# Patient Record
Sex: Female | Born: 1970 | Race: White | Hispanic: No | Marital: Married | Smoking: Never smoker
Health system: Southern US, Community
[De-identification: ages and names within clinical notes are randomized; demographics above are authoritative.]

## PROBLEM LIST (undated history)

## (undated) DIAGNOSIS — T8859XA Other complications of anesthesia, initial encounter: Secondary | ICD-10-CM

## (undated) DIAGNOSIS — T4145XA Adverse effect of unspecified anesthetic, initial encounter: Secondary | ICD-10-CM

## (undated) DIAGNOSIS — T884XXA Failed or difficult intubation, initial encounter: Secondary | ICD-10-CM

## (undated) HISTORY — DX: Failed or difficult intubation, initial encounter: T88.4XXA

## (undated) HISTORY — DX: Other complications of anesthesia, initial encounter: T88.59XA

---
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
## 2019-09-10 ENCOUNTER — Other Ambulatory Visit: Payer: Self-pay

## 2019-09-10 ENCOUNTER — Encounter
Payer: Medicare Other | Attending: Physical Medicine and Rehabilitation | Admitting: Physical Medicine and Rehabilitation

## 2019-09-10 ENCOUNTER — Encounter: Payer: Self-pay | Admitting: Physical Medicine and Rehabilitation

## 2019-09-10 VITALS — BP 119/83 | HR 79 | Temp 97.2°F

## 2019-09-10 DIAGNOSIS — N319 Neuromuscular dysfunction of bladder, unspecified: Secondary | ICD-10-CM | POA: Diagnosis not present

## 2019-09-10 DIAGNOSIS — Z79891 Long term (current) use of opiate analgesic: Secondary | ICD-10-CM | POA: Insufficient documentation

## 2019-09-10 DIAGNOSIS — G8221 Paraplegia, complete: Secondary | ICD-10-CM | POA: Diagnosis not present

## 2019-09-10 DIAGNOSIS — G909 Disorder of the autonomic nervous system, unspecified: Secondary | ICD-10-CM | POA: Insufficient documentation

## 2019-09-10 DIAGNOSIS — G894 Chronic pain syndrome: Secondary | ICD-10-CM

## 2019-09-10 DIAGNOSIS — Z5181 Encounter for therapeutic drug level monitoring: Secondary | ICD-10-CM | POA: Insufficient documentation

## 2019-09-10 MED ORDER — PREGABALIN 25 MG PO CAPS: 25.0000 mg | ORAL_CAPSULE | Freq: Two times a day (BID) | ORAL | 2 refills | Status: DC

## 2019-09-10 MED ORDER — OXYCODONE-ACETAMINOPHEN 5-325 MG PO TABS: 1.0000 | ORAL_TABLET | Freq: Four times a day (QID) | ORAL | 0 refills | Status: AC | PRN

## 2019-09-10 NOTE — Patient Instructions (Signed)
1. Neurogenic bladder- needs to see Dr Burman Foster or Dr Johnney Ou. Dr Johnney Ou is at [facility].  2. Autonomic Dysfunction- Symptomatically, having clear autonomic dysfunction- however, what's the trigger?- needs xrays to determine if has a fracture that could be causing the Autonomic dysfunction-if they are negative, then look for syrinx in thoracic spine, possibly cervical/lumbar spine prn.  3. Try Lyrica 25 mg 2x/day x 1 week then 50 mg 2x/day- for autonomic dysfunction   4. Percocet is the only thing helping her Symptoms- Percocet 5/325 mg up to 4x/day as needed- #120- will also get pain medicine contract, and oral drug screen swab done.   5.  If no fracture, will look for syrinx.  6. F/U in 1 month

## 2019-09-10 NOTE — Progress Notes (Signed)
Subjective:    Patient ID: Rachael CollaMelissa Oyama, female    DOB: [birth year], 48 y.o.   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   Pain, nausea, gas blowup, flushing- from UTIs- before treated for UTI. Percocet would treat Sx's Had 2 days left in UTI treatment- (determined by urine Cx),   By Friday, taking Percocet now and then; by Monday (dropped of U/A)- taking Percocet q6 hours- and sitting still to get through day.  So sick by Wednesday couldn't get self dressed- so sick couldn't sit upright. L kidney was a little "Distended" placed on IV ABX x 3 days- both samples came back basically clean. Sent for renal flow study- function of kidney good; draining a little slower  There for 1 week- had no clue what was going on. Takes less and less movement/Sx's to make her horribly sick-  So sick for so long, and frustrated. Let's try gabapentin- was started on gabapentin- horribly sick from Gabapentin- more dizzy/faint/explosive diarrhea, still problems with loose stools x 3 doses of gabapentin. Tested for COVID in hospital. Was (-) [facility] and without contrast.  No big difference between sitting or laying down BP.   Sitting up straight gets her sick/dizzy/nauseated/swelling up with tons of gas, and dry heaves- percocet has made it better (doesn't get her nauseated) Knot at bottom of coccyx is the only thing she noticed- doesn't think has had any trauma/falls/any reason to have pain.  No periods anymore.  Tried flushes with vinegar and gentamycin flushes- still gets 10-12 UTIs/year.  Needs to go back to Dr Gala LewandowskyBadlani- seeing another Urologist- Dr Patrice ParadiseGuiterrez- "[facility]". Stopped calcium- kidney stones are done since stopped calcium.  Meds: Dantrolene Baclofen Warfarin Magic Bullet M/W/F   PT- does dry  needling and spinal mobility technique that has helped her migraines.     Pain Inventory Average Pain 6 Pain Right Now 6 My pain is I can't feel it directly just the side effects  In the last 24 hours, has pain interfered with the following? General activity 10 Relation with others 10 Enjoyment of life 10 What TIME of day is your pain at its worst? morning and daytime and evening Sleep (in general) Poor  Pain is worse with: bending, sitting and some activites Pain improves with: rest and medication Relief from Meds: 5  Mobility ability to climb steps?  no do you drive?  yes use a wheelchair needs help with transfers transfers alone  Function retired I need assistance with the following:  bathing, toileting and household duties  Neuro/Psych bladder control problems bowel control problems numbness tingling spasms dizziness depression anxiety  Prior Studies CT/MRI  Physicians involved in your care Any changes since last visit?  no   No family history on file. Social History   Socioeconomic History  . Marital status: Unknown    Spouse name: Not on file  . Number of children: Not on file  . Years of education: Not on file  . Highest education level: Not on file  Occupational History  . Not on file  Social Needs  . Financial resource strain: Not on file  . Food insecurity    Worry: Not on file    Inability: Not on file  . Transportation needs    Medical: Not on file    Non-medical: Not on file  Tobacco Use  . Smoking status: Not on file  Substance and Sexual Activity  . Alcohol use: Not on file  . Drug use: Not on file  . Sexual activity: Not on file  Lifestyle  . Physical activity    Days per week: Not on file    Minutes per session: Not on file  . Stress: Not on file  Relationships  . Social Herbalist on phone: Not on file    Gets together: Not on file    Attends religious service: Not on file    Active member of club or  organization: Not on file    Attends meetings of clubs or organizations: Not on file    Relationship status: Not on file  Other Topics Concern  . Not on file  Social History Narrative  . Not on file   History reviewed. No pertinent surgical history. Past Medical History:  Diagnosis Date  . Complication of anesthesia   . Difficult intubation    BP 119/83   Pulse 79   Temp (!) 97.2 F (36.2 C)   SpO2 98%   Opioid Risk Score:   Fall Risk Score:  `1  Depression screen PHQ 2/9  No flowsheet data found.   Review of Systems  Constitutional: Positive for appetite change and diaphoresis. Negative for fever.  HENT: Negative.   Eyes: Negative.   Respiratory: Positive for apnea.   Gastrointestinal: Positive for abdominal pain, constipation, diarrhea, nausea and vomiting.  Endocrine: Negative.   Genitourinary: Negative.   Musculoskeletal: Negative.   Skin: Negative.   Allergic/Immunologic: Negative.   Neurological: Negative.   Hematological: Negative.   Psychiatric/Behavioral: Negative.   All other systems reviewed and are negative.      Objective:   Physical Exam Vitals reviewed Awake, alert, appropriate, sitting up in power w/c, leaning in recline/tilt,  NAD Keeps closing her eyes so concentrate Scalenes, splenius capitus  Levators, much looser than 5 years ago;  Upper traps are also looser, but not as much as other muscles LEs- 0/5 in LEs B/L Neuro- fine clonus of LEs 4-5 beats, MAS of 1 to 1+  B/L     Assessment & Plan:   Pt is a 48 yr old female with T7 paraplegia neurogenic bowel and bladder, uroconduit, Spasticity, and autonomic dysfunction due to an unknown cause.  1. Neurogenic bladder- needs to see Dr Burman Foster or Dr Johnney Ou. Dr Johnney Ou is at [facility].  2. Autonomic Dysfunction- Symptomatically, having clear autonomic dysfunction- however, what's the trigger?- needs xrays -ordered Pelvic, femur, knees, and feet xrays B/L to determine if has a  fracture that could be causing the Autonomic dysfunction-if they are negative, then look for syrinx in thoracic spine, possibly cervical/lumbar spine prn.  3. Try Lyrica 25 mg 2x/day x 1 week then 50 mg 2x/day- for autonomic dysfunction   4. Percocet is the only thing helping her Symptoms- Percocet 5/325 mg up to 4x/day as needed- #120- will also get pain medicine contract, and oral drug screen swab done.   5.  If no fracture, will look for syrinx.  6. F/U in 1 month   I spent more than 70 minutes on appointment- more than 40 minutes discussing spasticity, autonomic dysfunction- how to dx, treat, and pain meds- will try Lyrica, but had problems with Gabapentin. Also needs new urologist- made recs.

## 2019-09-12 ENCOUNTER — Other Ambulatory Visit: Payer: Self-pay | Admitting: Physical Medicine and Rehabilitation

## 2019-09-12 ENCOUNTER — Telehealth: Payer: Self-pay

## 2019-09-12 ENCOUNTER — Ambulatory Visit (HOSPITAL_COMMUNITY)
Admission: RE | Admit: 2019-09-12 | Discharge: 2019-09-12 | Disposition: A | Discharge status: Home / Self Care | Payer: Medicare Other | Source: Ambulatory Visit | Attending: Physical Medicine and Rehabilitation | Admitting: Physical Medicine and Rehabilitation

## 2019-09-12 ENCOUNTER — Other Ambulatory Visit: Payer: Self-pay

## 2019-09-12 DIAGNOSIS — G8221 Paraplegia, complete: Secondary | ICD-10-CM

## 2019-09-12 DIAGNOSIS — G909 Disorder of the autonomic nervous system, unspecified: Secondary | ICD-10-CM

## 2019-09-12 NOTE — Telephone Encounter (Signed)
Message from patient

## 2019-09-12 NOTE — Telephone Encounter (Signed)
Megan from Radiology called stating protocol for pelvis is one view not 3 views only do 3 view for surgeons and can get a better view of the knee with a femur. Orders changed.

## 2019-09-13 ENCOUNTER — Telehealth: Payer: Self-pay | Admitting: *Deleted

## 2019-09-13 NOTE — Telephone Encounter (Signed)
Prior auth submitted to [employer] via CoverMyMeds for pregabalin 25 mg #120.

## 2019-09-15 LAB — DRUG TOX MONITOR 1 W/CONF, ORAL FLD
Amphetamines: NEGATIVE ng/mL (ref <10)
Barbiturates: NEGATIVE ng/mL (ref <10)
Benzodiazepines: NEGATIVE ng/mL (ref <0.50)
Buprenorphine: NEGATIVE ng/mL (ref <0.10)
Cocaine: NEGATIVE ng/mL (ref <5.0)
Codeine: NEGATIVE ng/mL (ref <2.5)
Dihydrocodeine: NEGATIVE ng/mL (ref <2.5)
Fentanyl: NEGATIVE ng/mL (ref <0.10)
Heroin Metabolite: NEGATIVE ng/mL (ref <1.0)
Hydrocodone: NEGATIVE ng/mL (ref <2.5)
Hydromorphone: NEGATIVE ng/mL (ref <2.5)
MARIJUANA: NEGATIVE ng/mL (ref <2.5)
MDMA: NEGATIVE ng/mL (ref <10)
Meprobamate: NEGATIVE ng/mL (ref <2.5)
Methadone: NEGATIVE ng/mL (ref <5.0)
Morphine: NEGATIVE ng/mL (ref <2.5)
Nicotine Metabolite: NEGATIVE ng/mL (ref <5.0)
Norhydrocodone: NEGATIVE ng/mL (ref <2.5)
Noroxycodone: NEGATIVE ng/mL (ref <2.5)
Opiates: NEGATIVE ng/mL (ref <2.5)
Oxymorphone: NEGATIVE ng/mL (ref <2.5)
Phencyclidine: NEGATIVE ng/mL (ref <10)
Tapentadol: NEGATIVE ng/mL (ref <5.0)
Tramadol: NEGATIVE ng/mL (ref <5.0)
Zolpidem: NEGATIVE ng/mL (ref <5.0)

## 2019-09-15 LAB — DRUG TOX ALC METAB W/CON, ORAL FLD: Alcohol Metabolite: NEGATIVE ng/mL (ref <25)

## 2019-09-17 NOTE — Telephone Encounter (Signed)
Prior auth for Pregabalin was denied. An appeal will be initiated. [phone]).

## 2019-09-17 NOTE — Telephone Encounter (Signed)
Great- thank you- just let me know. She's already on Cymbalta and failed Gabapentin.

## 2019-09-18 ENCOUNTER — Telehealth: Payer: Self-pay | Admitting: *Deleted

## 2019-09-18 NOTE — Telephone Encounter (Signed)
Oral drug screen had insufficient specimen to complete review, so could not confirm presence or absence of oxycodone. Last dose of oxycodone was reported as same day of test.

## 2019-09-28 ENCOUNTER — Telehealth: Payer: Self-pay | Admitting: *Deleted

## 2019-09-28 DIAGNOSIS — G8221 Paraplegia, complete: Secondary | ICD-10-CM

## 2019-09-28 DIAGNOSIS — G909 Disorder of the autonomic nervous system, unspecified: Secondary | ICD-10-CM

## 2019-09-28 MED ORDER — PREGABALIN 50 MG PO CAPS: 50.0000 mg | ORAL_CAPSULE | Freq: Two times a day (BID) | ORAL | 2 refills | Status: DC

## 2019-09-28 MED ORDER — PREGABALIN 25 MG PO CAPS: 25.0000 mg | ORAL_CAPSULE | Freq: Two times a day (BID) | ORAL | 0 refills | Status: DC

## 2019-09-28 NOTE — Telephone Encounter (Signed)
I spoke with pharmacist at [facility] about whether Rashelle was able to get her pregabalin filled because the prior auth was denied. He said that it was on hold because it was several hundred dollars.  He said if we write the Rx for the 25 mg bid x 1 week, and then submit a second Rx for the 50 mg bid, it will go through and be covered.  I spoke with Dr Dagoberto Ligas and I will call in the Rxs as directed by the pharmacist. I let Kamalani know.  I have also added [facility] in [place] to her list which she uses as well.

## 2019-10-08 ENCOUNTER — Encounter: Payer: Self-pay | Admitting: Physical Medicine and Rehabilitation

## 2019-10-08 ENCOUNTER — Other Ambulatory Visit: Payer: Self-pay

## 2019-10-08 ENCOUNTER — Encounter
Payer: Medicare Other | Attending: Physical Medicine and Rehabilitation | Admitting: Physical Medicine and Rehabilitation

## 2019-10-08 VITALS — BP 113/73 | HR 75 | Temp 98.8°F

## 2019-10-08 DIAGNOSIS — G8221 Paraplegia, complete: Secondary | ICD-10-CM | POA: Diagnosis not present

## 2019-10-08 DIAGNOSIS — Z79891 Long term (current) use of opiate analgesic: Secondary | ICD-10-CM | POA: Diagnosis present

## 2019-10-08 DIAGNOSIS — M7918 Myalgia, other site: Secondary | ICD-10-CM | POA: Diagnosis not present

## 2019-10-08 DIAGNOSIS — Z5181 Encounter for therapeutic drug level monitoring: Secondary | ICD-10-CM | POA: Diagnosis present

## 2019-10-08 DIAGNOSIS — G909 Disorder of the autonomic nervous system, unspecified: Secondary | ICD-10-CM | POA: Insufficient documentation

## 2019-10-08 DIAGNOSIS — G894 Chronic pain syndrome: Secondary | ICD-10-CM | POA: Insufficient documentation

## 2019-10-08 DIAGNOSIS — R252 Cramp and spasm: Secondary | ICD-10-CM | POA: Diagnosis not present

## 2019-10-08 DIAGNOSIS — N319 Neuromuscular dysfunction of bladder, unspecified: Secondary | ICD-10-CM

## 2019-10-08 MED ORDER — PREGABALIN 100 MG PO CAPS: 100.0000 mg | ORAL_CAPSULE | Freq: Two times a day (BID) | ORAL | 5 refills | Status: DC

## 2019-10-08 NOTE — Progress Notes (Signed)
Subjective:    Patient ID: Rachael Turner, female    DOB: 02-02-1971, 48 y.o.   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   A lot better since a new UTI bug was dx'd- getting the nephrostomy stent might have made improvement as well-   Was 8/10 in terms of function at last visit; now 3/10- it took a week to feel better on IV ABX- recovery was [facility] slower than prior.  Want to change Nephrostomy tube/stent early- has appt Friday- supposedly, but has no Appointment- waiting for getting appt for nephrostomy still-  To con't ABX until Nephrostomy tube is changed.  Drove for first time today- felt really hot/icky- stopped and leaned back and it passed. Had to lean back 2-3x in [employer]- and even got to eat out that day.  1 Oxy yesterday and 1 Oxy today- and went 3-4 days before that.  On 2nd day of full dose of Lyrica- 50 mg BID- first few days of Lyrica 25 mg BID could sleep all day long- not happening now.  Spasticity/abd spasms are still up- has been up for awhile and hasn't gone down- - took Valium - thought should be better, but explained it won't since has nephrostomy tube.  Muscles tight in upper back- itching so bad and tight muscles.     Pain Inventory Average Pain 5 Pain Right Now 3 My pain is intermittent  In the last 24 hours, has pain interfered with the following? General activity 5 Relation with others 5 Enjoyment of life 5 What TIME of day is your pain at its worst? with activity Sleep (in general) Fair  Pain is worse with: some activites Pain improves with: rest, medication and leaning and tilting back Relief from Meds: 8  Mobility do you drive?  yes use a wheelchair needs help with transfers  Function  not employed: date last employed 05/2009 disabled: date disabled 10/08/2007 I need assistance with the following:  dressing, bathing and toileting  Neuro/Psych bowel control problems numbness tingling spasms dizziness depression anxiety  Prior Studies Any changes since last visit?  no  Physicians involved in your care Any changes since last visit?  no   No family history on file. Social History   Socioeconomic History  . Marital status: Unknown    Spouse name: Not on file  . Number of children: Not on file  . Years of education: Not on file  . Highest education level: Not on file  Occupational History  . Not on file  Social Needs  . Financial resource strain: Not on file  . Food insecurity    Worry: Not on file    Inability: Not on file  . Transportation needs    Medical: Not on file    Non-medical: Not on file  Tobacco Use  . Smoking status: Never Smoker  . Smokeless tobacco: Never Used  Substance and Sexual Activity  . Alcohol use: Never    Frequency: Never  . Drug use: Never  . Sexual activity: Yes    Birth control/protection: Surgical  Lifestyle  . Physical activity    Days per week: Not on file    Minutes per session: Not on file  . Stress: Not on file  Relationships  . Social connections  Talks on phone: Not on file    Gets together: Not on file    Attends religious service: Not on file    Active member of club or organization: Not on file    Attends meetings of clubs or organizations: Not on file    Relationship status: Not on file  Other Topics Concern  . Not on file  Social History Narrative  . Not on file   No past surgical history on file. Past Medical History:  Diagnosis Date  . Complication of anesthesia   . Difficult intubation    BP 113/73   Pulse 75   Temp 98.8 F (37.1 C)   SpO2 96%   Opioid Risk Score:   Fall Risk Score:  `1  Depression screen PHQ 2/9  Dep[facility] screen [facility] 2/9 10/08/2019 09/10/2019  Decreased  Interest 2 2  Down, Depressed, Hopeless 1 1  PHQ - 2 Score 3 3  Altered sleeping - 3  Tired, decreased energy - 3  Change in appetite - 3  Feeling bad or failure about yourself  - 1  Trouble concentrating - 1  Moving slowly or fidgety/restless - 1  Suicidal thoughts - 0  PHQ-9 Score - 15  Difficult doing work/chores - Very difficult    Review of Systems  Constitutional: Negative.   HENT: Negative.   Eyes: Negative.   Respiratory: Negative.   Cardiovascular: Positive for leg swelling.  Gastrointestinal: Positive for abdominal pain, constipation, diarrhea, nausea and vomiting.  Endocrine: Negative.   Genitourinary:       Had recent kidney infection  Musculoskeletal:       Spasms  Allergic/Immunologic: Negative.   Neurological: Positive for dizziness and numbness.       Tingling  Hematological: Negative.   Psychiatric/Behavioral: Positive for dysphoric mood. The patient is nervous/anxious.   All other systems reviewed and are negative.      Objective:   Physical Exam  Awake, alert, appropriate, in power w/c, NAD MS T7 paraplegia Trigger points in upper traps, levators, rhomboids and scalenes, and pecs Neuro-MAS in LEs is 2      Assessment & Plan:   Patient is here for f/u on autonomic dysfunction, with T7 paraplegia, spasticity, neurogenic bowel and bladder, as well as frequent unusual UTIs.   Patient here for trigger point injections for  Consent done and on chart.  Cleaned areas with alcohol and injected using a 27 gauge 1.5 inch needle  Injected 6cc Using 1% Lidocaine with no EPI  Upper traps B/L Levators B/L Posterior scalenes B/L Middle scalenes Splenius Capitus B/L Pectoralis Major- B/L Rhomboids- B/L Infraspinatus Teres Major/minor Thoracic paraspinals Lumbar paraspinals Other injections-    Patient's level of pain prior was 5/10  Current level of pain after injections is- not sure if has improvement, but felt good when hit "spots"  There  was no bleeding or complications.  Patient was advised to drink a lot of water on day after injections to flush system Will have increased soreness for 12-48 hours after injections.  Can use Lidocaine patches the day AFTER injections Can use theracane on day of injections in places didn't inject Can use heating pad 4-6 hours AFTER injections   2. Suggest Valium for abdominal spasms- gold standard- try to avoid oxycodone at same time as Valium. They potentiate each other- so make each other stronger.   3.  For upper back, neck tightness/itching,  Do Flexeril or myofascial release- 2-4 minutes using a theracane  Or tennis ball- for  tight upper back trigger points.   4. Don't expect spasticity to improve until nephrostomy tube is out x 1 week longer.   5.  Lyrica- will increase to 100 mg BID for autonomic dysfunction.   I spent a total of 1 hour on appointment -more than 30 minutes educating on myofascial pain, spasticity and which meds to work. Also did trigger point injections as well -

## 2019-10-08 NOTE — Patient Instructions (Signed)
  Patient is here for f/u on autonomic dysfunction, with T7 paraplegia, spasticity, neurogenic bowel and bladder, as well as frequent unusual UTIs.   Patient here for trigger point injections for  Consent done and on chart.  Cleaned areas with alcohol and injected using a 27 gauge 1.5 inch needle  Injected 6cc Using 1% Lidocaine with no EPI  Upper traps B/L Levators B/L Posterior scalenes B/L Middle scalenes Splenius Capitus B/L Pectoralis Major- B/L Rhomboids- B/L Infraspinatus Teres Major/minor Thoracic paraspinals Lumbar paraspinals Other injections-    Patient's level of pain prior was 5/10  Current level of pain after injections is- not sure if has improvement, but felt good when hit "spots"  There was no bleeding or complications.  Patient was advised to drink a lot of water on day after injections to flush system Will have increased soreness for 12-48 hours after injections.  Can use Lidocaine patches the day AFTER injections Can use theracane on day of injections in places didn't inject Can use heating pad 4-6 hours AFTER injections   2. Suggest Valium for abdominal spasms- gold standard- try to avoid oxycodone at same time as Valium. They potentiate each other- so make each other stronger.   3.  For upper back, neck tightness/itching,  Do Flexeril or myofascial release- 2-4 minutes using a theracane  Or tennis ball- for tight upper back trigger points.   4. Don't expect spasticity to improve until nephrostomy tube is out x 1 week longer.   5.  Lyrica- will increase to 100 mg BID for autonomic dysfunction.

## 2019-10-15 NOTE — Telephone Encounter (Signed)
Message from patient

## 2019-11-16 ENCOUNTER — Other Ambulatory Visit: Payer: Self-pay

## 2019-11-16 ENCOUNTER — Encounter: Payer: Self-pay | Admitting: Physical Medicine and Rehabilitation

## 2019-11-16 ENCOUNTER — Encounter
Payer: Medicare Other | Attending: Physical Medicine and Rehabilitation | Admitting: Physical Medicine and Rehabilitation

## 2019-11-16 VITALS — BP 118/80 | HR 85 | Temp 97.2°F

## 2019-11-16 DIAGNOSIS — Z79891 Long term (current) use of opiate analgesic: Secondary | ICD-10-CM | POA: Diagnosis present

## 2019-11-16 DIAGNOSIS — G8221 Paraplegia, complete: Secondary | ICD-10-CM

## 2019-11-16 DIAGNOSIS — G894 Chronic pain syndrome: Secondary | ICD-10-CM | POA: Diagnosis present

## 2019-11-16 DIAGNOSIS — N319 Neuromuscular dysfunction of bladder, unspecified: Secondary | ICD-10-CM | POA: Diagnosis not present

## 2019-11-16 DIAGNOSIS — R252 Cramp and spasm: Secondary | ICD-10-CM | POA: Diagnosis not present

## 2019-11-16 DIAGNOSIS — G909 Disorder of the autonomic nervous system, unspecified: Secondary | ICD-10-CM | POA: Diagnosis not present

## 2019-11-16 DIAGNOSIS — Z5181 Encounter for therapeutic drug level monitoring: Secondary | ICD-10-CM | POA: Insufficient documentation

## 2019-11-16 DIAGNOSIS — N390 Urinary tract infection, site not specified: Secondary | ICD-10-CM

## 2019-11-16 MED ORDER — OXYCODONE-ACETAMINOPHEN 5-325 MG PO TABS: 1.0000 | ORAL_TABLET | Freq: Four times a day (QID) | ORAL | 0 refills | Status: DC | PRN

## 2019-11-16 MED ORDER — 0.9 % SODIUM CHLORIDE (POUR BTL) OPTIME: 1000.0000 mL | Freq: Every day | TOPICAL | 11 refills | Status: DC

## 2019-11-16 NOTE — Progress Notes (Signed)
Subjective:    Patient ID: Rachael Turner, female    DOB: 25-Mar-1971, 48 y.o.   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   Fighting another UTI again- sent off sample to PCP Monday- grew some odd stuff and sending to state lab. Started last week before finished last ABX.  Last Thursday and Friday- and really weird gunk in Urine. Why still on Cipro.  Needs refill of oxycodone-   Now sending to state lab- labs/U Cx- trying to avoid getting C Diff again in future.  Husband has gotten an paranoia of C Diff as well.   Dr Carlota Raspberry- that kidney wasn't the cause that was issue. So came up with schedule for pulling stent- IR pulling stent faster- to get tube removed this coming Tuesday 11/24- IR to do a uterine fibroid embolism- in December-  Dr badlani- no fever, so not treating- regardless of dizziness and nausea, dry heaves and vomiting/pain. However, pt has no fever until she's septic.  Thinks UTIs is the cause- of her autonomic dysfunction.     Pain Inventory Average Pain 6 Pain Right Now 7 My pain is stabbing and aching  In the last 24 hours, has pain interfered with the following? General activity 8 Relation with others 8 Enjoyment of life 8 What TIME of day is your pain at its worst? evening Sleep (in general) Fair  Pain is worse with: na Pain improves with: rest and medication Relief from Meds: 8  Mobility ability to climb steps?  no do you drive?  yes use a wheelchair transfers alone  Function disabled: date disabled 09/2017 I need assistance with the following:  dressing, bathing, meal prep and household duties  Neuro/Psych bowel control problems spasms dizziness  Prior Studies Any changes since last visit?  no  Physicians  involved in your care Any changes since last visit?  no   No family history on file. Social History   Socioeconomic History  . Marital status: Unknown    Spouse name: Not on file  . Number of children: Not on file  . Years of education: Not on file  . Highest education level: Not on file  Occupational History  . Not on file  Social Needs  . Financial resource strain: Not on file  . Food insecurity    Worry: Not on file    Inability: Not on file  . Transportation needs    Medical: Not on file    Non-medical: Not on file  Tobacco Use  . Smoking status: Never Smoker  . Smokeless tobacco: Never Used  Substance and Sexual Activity  . Alcohol use: Never    Frequency: Never  . Drug use: Never  . Sexual activity: Yes    Birth control/protection: Surgical  Lifestyle  . Physical activity    Days per week: Not on file    Minutes per session: Not on file  . Stress: Not on file  Relationships  . Social Herbalist on phone: Not on file    Gets together: Not on file    Attends religious service: Not on file    Active member of club or organization: Not on file    Attends meetings of clubs or organizations: Not on file    Relationship status: Not on file  Other Topics Concern  . Not on file  Social History Narrative  . Not on file   No past surgical history on file. Past Medical History:  Diagnosis Date  . Complication of anesthesia   . Difficult intubation    BP 118/80   Pulse 85   Temp (!) 97.2 F (36.2 C)   SpO2 98%   Opioid Risk Score:   Fall Risk Score:  `1  Depression screen PHQ 2/9  Depression screen [facility] 2/9 10/08/2019 09/10/2019  Decreased Interest 2 2  Down, Depressed, Hopeless 1 1  PHQ - 2 Score 3 3  Altered sleeping - 3  Tired, decreased energy - 3  Change in appetite - 3  Feeling bad or failure about yourself  - 1  Trouble concentrating - 1  Moving slowly or fidgety/restless - 1  Suicidal thoughts - 0  PHQ-9 Score - 15  Difficult  doing work/chores - Very difficult   Review of Systems  Constitutional: Positive for chills and unexpected weight change.  Gastrointestinal: Positive for abdominal pain, constipation, diarrhea, nausea and vomiting.  Neurological: Positive for dizziness.  All other systems reviewed and are negative.      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   1. Refill Oxycodone #120-  Lasted 2+ months last time. For autonomic dysfunction.  2. Vinegar infusion- Can use distilled water (trhrow out after using each time)- or saline water-1000cc and 1 tablespoon of white vinegar- distill in 60 cc at a time- wouldn't use more than 100cc- leave  In bladder 15-30 minutes, then cath out shouldn't leave concoction in frig more than 1 week. Sent Rx for saline - 1-2x/week .  3. Add Cranberry pills if want to- in ADDITION to infusion in bladder.  4.  Discussed UTI and treatment of them- and need for trying to avoid treating them all, if possible. - discussed at LENGTH- x 15 minutes. Concern for superbug that might cause more complications down the road.  5. Cinnamon pills  2-4 grams/day. Can't tolerate Tumeric. Wait for 1 week so will know when has side effects.  6.  Trying to reduce amount of UTIs overall.  7. F/U 6 weeks  I spent a total of 45 minutes on visit- more than 30 minutes on discussing UTI treatments- ways to PREVENT UTIs in the long run. And concern for superbug in the long run.

## 2019-11-16 NOTE — Patient Instructions (Signed)
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   1. Refill Oxycodone #120-  Lasted 2+ months last time. For autonomic dysfunction.  2. Vinegar infusion- Can use distilled water (trhrow out after using each time)- or saline water-1000cc and 1 tablespoon of white vinegar- distill in 60 cc at a time- wouldn't use more than 100cc- leave  In bladder 15-30 minutes, then cath out shouldn't leave concoction in frig more than 1 week. Sent Rx for saline - 1-2x/week .  3. Add Cranberry pills if want to- in ADDITION to infusion in bladder.  4.  Discussed UTI and treatment of them- and need for trying to avoid treating them all, if possible. - discussed at LENGTH- x 15 minutes. Concern for superbug that might cause more complications down the road.  5. Cinnamon pills  2-4 grams/day. Can't tolerate Tumeric. Wait for 1 week so will know when has side effects.  6.  Trying to reduce amount of UTIs overall.  7. F/U 6 weeks

## 2019-12-10 MED ORDER — OXYCODONE-ACETAMINOPHEN 5-325 MG PO TABS: 1.0000 | ORAL_TABLET | Freq: Four times a day (QID) | ORAL | 0 refills | Status: DC | PRN

## 2019-12-10 NOTE — Telephone Encounter (Signed)
Can refill as of 12/16, however I must admit, thought it would last longer.  I'm also a little concerned how will make so constipated, and concern about amount increasing so much.   However will give pt the benefit since she asking to refill before 1/8 and it's due to be refilled 12/16, so did refill as of 12/16, but CANNOT refill early.

## 2020-01-04 ENCOUNTER — Encounter: Payer: Medicare PPO | Admitting: Physical Medicine and Rehabilitation

## 2020-01-11 ENCOUNTER — Encounter: Payer: Self-pay | Admitting: Physical Medicine and Rehabilitation

## 2020-01-11 ENCOUNTER — Encounter: Payer: Medicare PPO | Attending: Physical Medicine and Rehabilitation | Admitting: Physical Medicine and Rehabilitation

## 2020-01-11 ENCOUNTER — Other Ambulatory Visit: Payer: Self-pay

## 2020-01-11 VITALS — BP 93/68 | HR 73 | Temp 97.8°F | Ht 70.0 in | Wt 269.0 lb

## 2020-01-11 DIAGNOSIS — G8221 Paraplegia, complete: Secondary | ICD-10-CM | POA: Insufficient documentation

## 2020-01-11 DIAGNOSIS — Z5181 Encounter for therapeutic drug level monitoring: Secondary | ICD-10-CM | POA: Insufficient documentation

## 2020-01-11 DIAGNOSIS — N319 Neuromuscular dysfunction of bladder, unspecified: Secondary | ICD-10-CM | POA: Insufficient documentation

## 2020-01-11 DIAGNOSIS — Z79891 Long term (current) use of opiate analgesic: Secondary | ICD-10-CM | POA: Diagnosis present

## 2020-01-11 DIAGNOSIS — G909 Disorder of the autonomic nervous system, unspecified: Secondary | ICD-10-CM

## 2020-01-11 DIAGNOSIS — N390 Urinary tract infection, site not specified: Secondary | ICD-10-CM

## 2020-01-11 DIAGNOSIS — R252 Cramp and spasm: Secondary | ICD-10-CM | POA: Diagnosis not present

## 2020-01-11 DIAGNOSIS — G894 Chronic pain syndrome: Secondary | ICD-10-CM | POA: Diagnosis present

## 2020-01-11 MED ORDER — ONDANSETRON HCL 8 MG PO TABS: 8.0000 mg | ORAL_TABLET | Freq: Three times a day (TID) | ORAL | 5 refills | Status: DC | PRN

## 2020-01-11 MED ORDER — DANTROLENE SODIUM 100 MG PO CAPS: 100.0000 mg | ORAL_CAPSULE | Freq: Three times a day (TID) | ORAL | 11 refills | Status: DC

## 2020-01-11 MED ORDER — BACLOFEN 20 MG PO TABS: 40.0000 mg | ORAL_TABLET | Freq: Three times a day (TID) | ORAL | 11 refills | Status: DC

## 2020-01-11 MED ORDER — DIAZEPAM 5 MG PO TABS: 5.0000 mg | ORAL_TABLET | Freq: Two times a day (BID) | ORAL | 1 refills | Status: DC | PRN

## 2020-01-11 NOTE — Patient Instructions (Signed)
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  1. Can try Lasix if HCTZ doesn't end up working. For fluid retention.   2. Doing well on Percocet currently.  Has 60+ pills left currently.  Doesn't need new Rx  3. Wrote for Baclofen 40 mg TID 1 year supply 4. Dantrolene 100 mg TID- 1 year supply 5. Zofran 8 mg TID prn #5 RFs 6. Diazepam-5 mg BID prn for spasms- 1 RF- PCP hasn't been prescribing-  7. F/U - 2 months

## 2020-01-11 NOTE — Progress Notes (Signed)
Subjective:    Patient ID: Rachael Turner, female    DOB: September 02, 1971, 49 y.o.   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  Had nephrostomy- then plan for remove it-  Still had Bad/raging UTI- capped nephrostomy for 1 week to then remove it- up dry heaving, and felt sick- uncapped it and lots of glop came out- so much better after drained into bag.  Dr Gala Lewandowsky- urine spent more time being clear since nephrostomy tube draining into bag OR into bladder via stent.  Scar tissue in there causing drainage problem? Slow to drain kidney and thinking that's this is the cause of pain/dry heaves, etc. in last 3 years.   Today- didn't feel great as usual- will push fluids.  Trying to shrink fibroid-  IR didn't want to do with nephrostomy tube-  Got it put on calendar February 3rd.    Legs have shrunk dramatically on HCTZ 12.5 mg- gassy and low level  Nausea with it- all the time.  Taking it at bedtime.  Still making ill/dry heaves. Still difficulty with diarrhea/still taking Miralax full dose. Has made a big difference in transfers and legs 3 inches closer together. Easier to transfer.    Wearing pants- Mother designed new pants to still see wound on R posterior thigh  Husband- hasn't been great in tolerating and helping her since he thought she was going to die, was so sick- and not handling it well at all.   Spasticity has eased up since the nephrostomy stent was placed again/uncapped.  Also hoping Fibroid embolization will hep spasticity as well.    Sometimes will have buttocks and low back are spasming- on back side, not front-  Thought due to BM, but wondering if due to HCTZ? Only having since HCTZ- wasn't immediate???    Pain  Inventory Average Pain 5 Pain Right Now 6 My pain is dull  In the last 24 hours, has pain interfered with the following? General activity 7 Relation with others 7 Enjoyment of life 7 What TIME of day is your pain at its worst? all Sleep (in general) Fair  Pain is worse with: some activites Pain improves with: rest and medication Relief from Meds: 9  Mobility ability to climb steps?  no do you drive?  yes use a wheelchair needs help with transfers  Function retired I need assistance with the following:  bathing, toileting and household duties  Neuro/Psych bladder control problems bowel control problems tingling trouble walking spasms dizziness depression anxiety  Prior Studies Any changes since last visit?  no  Physicians involved in your care Any changes since last visit?  no   No family history on file. Social History   Socioeconomic History  . Marital status: Unknown    Spouse name: Not on file  . Number of children: Not on file  . Years of education: Not on file  . Highest education level: Not on file  Occupational History  . Not on file  Tobacco Use  . Smoking status: Never Smoker  . Smokeless tobacco: Never Used  Substance and Sexual Activity  . Alcohol use: Never  . Drug use: Never  . Sexual activity: Yes    Birth control/protection: Surgical  Other Topics Concern  . Not on file  Social History Narrative  .  Not on file   Social Determinants of Health   Financial Resource Strain:   . Difficulty of Paying Living Expenses: Not on file  Food Insecurity:   . Worried About Programme researcher, broadcasting/film/video in the Last Year: Not on file  . Ran Out of Food in the Last Year: Not on file  Transportation Needs:   . Lack of Transportation (Medical): Not on file  . Lack of Transportation (Non-Medical): Not on file  Physical Activity:   . Days of Exercise per Week: Not on file  . Minutes of Exercise per Session: Not on file  Stress:   . Feeling of Stress :  Not on file  Social Connections:   . Frequency of Communication with Friends and Family: Not on file  . Frequency of Social Gatherings with Friends and Family: Not on file  . Attends Religious Services: Not on file  . Active Member of Clubs or Organizations: Not on file  . Attends Banker Meetings: Not on file  . Marital Status: Not on file   No past surgical history on file. Past Medical History:  Diagnosis Date  . Complication of anesthesia   . Difficult intubation    BP 93/68   Pulse 73   Temp 97.8 F (36.6 C)   Ht 5\' 10"  (1.778 m)   Wt 269 lb (122 kg)   SpO2 96%   BMI 38.60 kg/m   Opioid Risk Score:   Fall Risk Score:  `1  Depression screen PHQ 2/9  Depression screen [facility] 2/9 10/08/2019 09/10/2019  Decreased Interest 2 2  Down, Depressed, Hopeless 1 1  PHQ - 2 Score 3 3  Altered sleeping - 3  Tired, decreased energy - 3  Change in appetite - 3  Feeling bad or failure about yourself  - 1  Trouble concentrating - 1  Moving slowly or fidgety/restless - 1  Suicidal thoughts - 0  PHQ-9 Score - 15  Difficult doing work/chores - Very difficult     Review of Systems  Constitutional: Positive for appetite change and diaphoresis.  HENT: Negative.   Eyes: Negative.   Respiratory: Negative.   Cardiovascular: Positive for leg swelling.  Gastrointestinal: Positive for abdominal pain, constipation, diarrhea, nausea and vomiting.  Endocrine: Negative.   Genitourinary: Negative.   Musculoskeletal: Negative.   Skin: Positive for rash.  Allergic/Immunologic: Negative.   Neurological: Negative.   Hematological: Negative.   Psychiatric/Behavioral: Negative.   All other systems reviewed and are negative.      Objective:   Physical Exam Looks a lot better than last 2 visits- better color; not as fatigued as before, NAD Has makeup on for first time.  Puffiness in LEs MUCH better- like different person No pitting edema anymore 1-2+ edema, but much  improved Wearing PRAFOs A few spasms with ROM of RLE but none with LLE.       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  1. Can try Lasix if HCTZ doesn't end up working. For fluid retention.   2. Doing well on Percocet currently.  Has 60+ pills left currently.  Doesn't need new Rx  3. Wrote for Baclofen 40 mg TID 1 year supply 4. Dantrolene 100 mg TID- 1 year supply 5. Zofran 8 mg TID prn #5 RFs 6. Diazepam-5 mg BID  prn for spasms- 1 RF- PCP hasn't been prescribing-  7. F/U - 2 months  I spent a total of 40 minutes on appointment; more than 25 minutes discussing plans as above including  Pain and Autonomic dysfunction control , IR embolization and LE edema control

## 2020-02-05 NOTE — Telephone Encounter (Signed)
Message from patient

## 2020-02-07 MED ORDER — OXYCODONE-ACETAMINOPHEN 5-325 MG PO TABS: 1.0000 | ORAL_TABLET | Freq: Four times a day (QID) | ORAL | 0 refills | Status: DC | PRN

## 2020-02-07 NOTE — Telephone Encounter (Signed)
Sent in Rx for percocet.   In terms of Migraines, we will need to discuss prophylaxis at her next visit for migraines to see what she's tried and hasn't tried.

## 2020-02-08 ENCOUNTER — Telehealth: Payer: Self-pay

## 2020-02-08 NOTE — Telephone Encounter (Signed)
Recieved faxed notification of the need for a prior auth for oxy-apap 5-325mg  medication.  Prior auth started on 02-07-2020 recieved approval for prior auth for oxy-apap 5-325 today on 02-08-2020 and is approved from 12-28-2019 - 12-26-2020  Both pharmacy and patient notified.

## 2020-03-07 ENCOUNTER — Ambulatory Visit: Payer: Medicare PPO | Admitting: Physical Medicine and Rehabilitation

## 2020-03-14 ENCOUNTER — Encounter: Payer: Medicare PPO | Admitting: Physical Medicine and Rehabilitation

## 2020-03-21 ENCOUNTER — Other Ambulatory Visit: Payer: Self-pay

## 2020-03-21 ENCOUNTER — Encounter: Payer: Medicare PPO | Attending: Physical Medicine and Rehabilitation | Admitting: Physical Medicine and Rehabilitation

## 2020-03-21 ENCOUNTER — Other Ambulatory Visit: Payer: Self-pay | Admitting: Physical Medicine and Rehabilitation

## 2020-03-21 ENCOUNTER — Encounter: Payer: Self-pay | Admitting: Physical Medicine and Rehabilitation

## 2020-03-21 VITALS — BP 107/76 | HR 68 | Temp 97.5°F | Ht 70.0 in | Wt 269.0 lb

## 2020-03-21 DIAGNOSIS — M533 Sacrococcygeal disorders, not elsewhere classified: Secondary | ICD-10-CM

## 2020-03-21 DIAGNOSIS — Z5181 Encounter for therapeutic drug level monitoring: Secondary | ICD-10-CM | POA: Insufficient documentation

## 2020-03-21 DIAGNOSIS — Z79891 Long term (current) use of opiate analgesic: Secondary | ICD-10-CM | POA: Insufficient documentation

## 2020-03-21 DIAGNOSIS — G894 Chronic pain syndrome: Secondary | ICD-10-CM | POA: Insufficient documentation

## 2020-03-21 DIAGNOSIS — N319 Neuromuscular dysfunction of bladder, unspecified: Secondary | ICD-10-CM | POA: Insufficient documentation

## 2020-03-21 DIAGNOSIS — G8221 Paraplegia, complete: Secondary | ICD-10-CM

## 2020-03-21 DIAGNOSIS — M7918 Myalgia, other site: Secondary | ICD-10-CM

## 2020-03-21 DIAGNOSIS — N390 Urinary tract infection, site not specified: Secondary | ICD-10-CM | POA: Diagnosis not present

## 2020-03-21 DIAGNOSIS — G909 Disorder of the autonomic nervous system, unspecified: Secondary | ICD-10-CM

## 2020-03-21 MED ORDER — OXYCODONE-ACETAMINOPHEN 5-325 MG PO TABS: 1.0000 | ORAL_TABLET | ORAL | 0 refills | Status: DC | PRN

## 2020-03-21 MED ORDER — METOLAZONE 2.5 MG PO TABS: 2.5000 mg | ORAL_TABLET | Freq: Every day | ORAL | 1 refills | Status: DC

## 2020-03-21 NOTE — Patient Instructions (Addendum)
  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 With B/L heel, R ischial tuberosity and R posterior thigh  1. Try Zaroxylyn/Metolazone 2.5 mg daily- is equal to 40 mg Lasix or even more usually. Still needs potassium- changing due to severe headaches/stomach upset with Lasix and HCTZ.   2. Wearing Heel Medix boots- to prevent wounds/pressure on heels   3. Needs imaging of of "lump" on tailbone- getting bigger- not mobile under skin.  Will get xray of tailbone  And see what it is- concerning to pt.   4. Sent in refil of Percocet- has 57 pills left but having surgery next week- will make sure she has it -has been >1 month  5. F/U- 1 month- double appointment-Getting new w/c 4/15- has loaner - call Kathee Delton from [employer] for suggestions for offloading.

## 2020-03-21 NOTE — Progress Notes (Signed)
Subjective:    Patient ID: Rachael Turner, female    DOB: Sep 18, 1971, 49 y.o.   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   SAW ID today- Dr Roseanne Reno at [facility] this AM.  UTI Sx's are getting worse and ID decided on a new regimen.  Going to put on Fosfomycin daily through Tuesday next week. Til pre-op- wrote Rx for 12 doses- until gets Urine Cx results.  Might continue or switch depending on Cx results.  Down to 1-2 IV ABX that works.    Going to go on to figure out what was keeping kidney from draining.  Dr Sharen Hones wants to go in. Urologist at [facility].  Wants to see if scar tissue vs stone that won't show up on imaging.  Scheduled 4/6.  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   Seeing wound care-  Has a lot of wounds.   Asking about Lasix- IM- Lasix is bothering stomach horribly.  Tried HCTZ in past- bothers stomach and   Off Lasix x 5 days- thinks Lasix causing daily migraines- when stops it, migraines goes away but if takes either Lasix/HCTZ has daily migraines. Her GI tract "supersensitive".   Was taking Lasix for Swelling of feet, LEs- and gets more pressure on feet and wounds- extra fluid caused more pressure on wounds/more damage.   Wounds- R ischial tuberosity, Back of R thigh- near knee (1/2 cm deep- shrunk a lot) B/L heels. Doesn't know the stages. Wearing new Prevalon boots-  Wound care near house- not [facility].  Bump on tailbone area- between cheeks above anus- not a cyst- like on bone- doesn't move around.  Thinks getting bigger-   Pain Inventory Average Pain 6 Pain Right Now 7 My pain is constant, sharp, dull and aching  In the last 24 hours, has pain interfered with the following? General activity 5  Relation with others 5 Enjoyment of life 7 What TIME of day is your pain at its worst? varies Sleep (in general) Fair  Pain is worse with: some activites Pain improves with: heat/ice, therapy/exercise and medication Relief from Meds: 4  Mobility use a wheelchair needs help with transfers transfers alone  Function not employed: date last employed . disabled: date disabled . retired I need assistance with the following:  dressing, bathing, toileting and household duties  Neuro/Psych bowel control problems spasms dizziness depression anxiety  Prior Studies Any changes since last visit?  no  Physicians involved in your care Any changes since last visit?  no   History reviewed. No pertinent family history. Social History   Socioeconomic History  . Marital status: Unknown    Spouse name: Not on file  . Number of children: Not on file  . Years of education: Not on file  . Highest education level: Not on file  Occupational History  . Not on file  Tobacco Use  . Smoking status: Never Smoker  . Smokeless tobacco: Never Used  Substance and Sexual Activity  . Alcohol use: Never  . Drug use: Never  . Sexual activity: Yes    Birth control/protection: Surgical  Other Topics Concern  . Not on file  Social History Narrative  . Not on file   Social Determinants of Health   Financial Resource Strain:   . Difficulty of Paying Living  Expenses:   Food Insecurity:   . Worried About Charity fundraiser in the Last Year:   . Arboriculturist in the Last Year:   Transportation Needs:   . Film/video editor (Medical):   Marland Kitchen Lack of Transportation (Non-Medical):   Physical Activity:   . Days of Exercise per Week:   . Minutes of Exercise per Session:   Stress:   . Feeling of Stress :   Social Connections:   . Frequency of Communication with Friends and Family:   . Frequency of Social Gatherings with Friends and Family:   . Attends Religious Services:   . Active  Member of Clubs or Organizations:   . Attends Archivist Meetings:   Marland Kitchen Marital Status:    History reviewed. No pertinent surgical history. Past Medical History:  Diagnosis Date  . Complication of anesthesia   . Difficult intubation    There were no vitals taken for this visit.  Opioid Risk Score:   Fall Risk Score:  `1  Depression screen PHQ 2/9  Depression screen [facility] 2/9 10/08/2019 09/10/2019  Decreased Interest 2 2  Down, Depressed, Hopeless 1 1  PHQ - 2 Score 3 3  Altered sleeping - 3  Tired, decreased energy - 3  Change in appetite - 3  Feeling bad or failure about yourself  - 1  Trouble concentrating - 1  Moving slowly or fidgety/restless - 1  Suicidal thoughts - 0  PHQ-9 Score - 15  Difficult doing work/chores - Very difficult     Review of Systems  Constitutional: Negative.   HENT: Negative.   Eyes: Negative.   Respiratory: Negative.   Cardiovascular: Negative.   Gastrointestinal: Positive for abdominal pain, constipation, diarrhea, nausea and vomiting.  Endocrine: Negative.   Genitourinary: Negative.   Musculoskeletal: Positive for back pain and neck pain.  Skin: Positive for rash.  Allergic/Immunologic: Negative.   Neurological: Positive for dizziness.  Hematological: Negative.   Psychiatric/Behavioral: Positive for dysphoric mood. The patient is nervous/anxious.   All other systems reviewed and are negative.      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 With B/L heel, R ischial tuberosity and R posterior thigh  1. Try Zaroxylyn/Metolazone 2.5 mg daily- is equal to 40 mg Lasix or even more usually. Still needs potassium- changing due to severe headaches/stomach upset with Lasix and HCTZ.   2. Wearing Heel Medix boots- to prevent wounds/pressure on heels   3. Needs imaging of of "lump" on tailbone- getting bigger- not mobile under skin.  Will get xray of tailbone  And see what it is- concerning to pt.   4. Sent in refil of Percocet- has 57 pills left but having surgery next week- will make sure she has it -has been >1 month  5. F/U- 1 month- double appointment-Getting new w/c 4/15- has loaner - call Davonna Belling from [employer] for suggestions for offloading.   I spent  a total of 50 minutes on appointment- more than 30 minutes discussing UTI issues and wounds.

## 2020-04-22 ENCOUNTER — Ambulatory Visit (HOSPITAL_COMMUNITY)
Admission: RE | Admit: 2020-04-22 | Discharge: 2020-04-22 | Disposition: A | Discharge status: Home / Self Care | Payer: Medicare PPO | Source: Ambulatory Visit | Attending: Physical Medicine and Rehabilitation | Admitting: Physical Medicine and Rehabilitation

## 2020-04-22 ENCOUNTER — Other Ambulatory Visit: Payer: Self-pay

## 2020-04-22 DIAGNOSIS — M533 Sacrococcygeal disorders, not elsewhere classified: Secondary | ICD-10-CM | POA: Diagnosis present

## 2020-04-25 ENCOUNTER — Encounter: Payer: Self-pay | Admitting: Physical Medicine and Rehabilitation

## 2020-04-25 ENCOUNTER — Other Ambulatory Visit: Payer: Self-pay

## 2020-04-25 ENCOUNTER — Encounter: Payer: Medicare PPO | Attending: Physical Medicine and Rehabilitation | Admitting: Physical Medicine and Rehabilitation

## 2020-04-25 DIAGNOSIS — N319 Neuromuscular dysfunction of bladder, unspecified: Secondary | ICD-10-CM | POA: Insufficient documentation

## 2020-04-25 DIAGNOSIS — N390 Urinary tract infection, site not specified: Secondary | ICD-10-CM

## 2020-04-25 DIAGNOSIS — Z79891 Long term (current) use of opiate analgesic: Secondary | ICD-10-CM | POA: Insufficient documentation

## 2020-04-25 DIAGNOSIS — M533 Sacrococcygeal disorders, not elsewhere classified: Secondary | ICD-10-CM | POA: Insufficient documentation

## 2020-04-25 DIAGNOSIS — G8221 Paraplegia, complete: Secondary | ICD-10-CM

## 2020-04-25 DIAGNOSIS — G909 Disorder of the autonomic nervous system, unspecified: Secondary | ICD-10-CM | POA: Insufficient documentation

## 2020-04-25 DIAGNOSIS — Z5181 Encounter for therapeutic drug level monitoring: Secondary | ICD-10-CM | POA: Insufficient documentation

## 2020-04-25 DIAGNOSIS — G894 Chronic pain syndrome: Secondary | ICD-10-CM | POA: Insufficient documentation

## 2020-04-25 NOTE — Patient Instructions (Signed)
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 With B/L heel, R ischial tuberosity and R posterior thigh pressure ulcers being followed by wound care.  Also has new C diff dx- on PO Vanc.    1. Needs CT of sacrum with contrast for further assessment. Ordered lumbar spine because no sacrum CT in computer, but detailed why I ordered.   2. No refills needed of pain meds right now; nor Baclofen, Dantrolene, Valium, etc.   3. Suggested asking PCP about other diuretics, but sounds like she already has, and PCP doesn't have other ideas either.   4. Make sure don't get CT scan until C Diff treated.  5. F/U 6 weeks- double appointment

## 2020-04-25 NOTE — Progress Notes (Signed)
Subjective:    Patient ID: Rachael Turner, female    DOB: September 27, 1971, 49 y.o.   MRN: [id number]  HPI   Due to national recommendations of social distancing because of COVID 75, an audio/video tele-health visit is felt to be the most appropriate encounter for this patient at this time. See MyChart message from today for the patient's consent to a tele-health encounter with [facility]. This is a follow up tele-visit via Webex. The patient is at home. MD is at office.    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 With B/L heel, R ischial tuberosity and R posterior thigh pressure ulcers being followed by wound care.  Also has new C diff dx- on PO Vanc.   Pt reports BP last night was 88/55 and this AM 96/61- felt a little lightheaded and mild nausea.   Diarrhea from C Diff is getting better- using miralax again.  S/P kidney procedure- stent comes out 5/3 and s/p Uterine fibroid embolization early April, but hard ot tell if did anything because everything else going on.   Drinks 3-4L/day but lately more like 3L/day of water- so encouraged her to drink ~ 1/2 L to 1L /day up to the 4L/day mark due to low BP.  Off PICC line/IV ABX as of ~ 2 weeks ago.   Still having N/V/ migraines every time takes any diuretic medication. So only takes when feels fluid overloaded and no migraines.   Feels like has tons of water weight on her right now, but will BP low, encouraged her to not take diuretic right now.   Usually ~2 percocet/day- still has 80 left- back up to 2 pills/day after IV ABX and taking more Zofran for more nausea.          Pain Inventory Average Pain 7 Pain Right Now 7 My pain is constant, sharp, dull and aching  In the last 24 hours, has  pain interfered with the following? General activity 5 Relation with others 5 Enjoyment of life 7 What TIME of day is your pain at its worst? varies Sleep (in general) Fair  Pain is worse with: some activites Pain improves with: heat/ice, therapy/exercise and medication Relief from Meds: 4  Mobility use a wheelchair needs help with transfers  Function disabled: date disabled . I need assistance with the following:  dressing, bathing, toileting, household duties and shopping  Neuro/Psych bowel control problems spasms dizziness depression anxiety  Prior Studies bloodwork  Physicians involved in your care C Diff   No family history on file. Social History   Socioeconomic History  . Marital status: Unknown    Spouse name: Not on file  . Number of children: Not on file  . Years of education: Not on file  . Highest education level: Not on file  Occupational History  . Not on file  Tobacco Use  . Smoking status: Never Smoker  . Smokeless tobacco: Never Used  Substance and Sexual Activity  . Alcohol use: Never  . Drug use: Never  . Sexual activity: Yes    Birth control/protection: Surgical  Other Topics Concern  . Not on file  Social History Narrative  . Not on file   Social Determinants of Health   Financial Resource Strain:   . Difficulty of Paying Living Expenses:   Food Insecurity:   .  Worried About Charity fundraiser in the Last Year:   . Arboriculturist in the Last Year:   Transportation Needs:   . Film/video editor (Medical):   Marland Kitchen Lack of Transportation (Non-Medical):   Physical Activity:   . Days of Exercise per Week:   . Minutes of Exercise per Session:   Stress:   . Feeling of Stress :   Social Connections:   . Frequency of Communication with Friends and Family:   . Frequency of Social Gatherings with Friends and Family:   . Attends Religious Services:   . Active Member of Clubs or Organizations:   . Attends Archivist  Meetings:   Marland Kitchen Marital Status:    No past surgical history on file. Past Medical History:  Diagnosis Date  . Complication of anesthesia   . Difficult intubation    There were no vitals taken for this visit.  Opioid Risk Score:   Fall Risk Score:  `1  Depression screen PHQ 2/9  Depression screen [facility] 2/9 10/08/2019 09/10/2019  Decreased Interest 2 2  Down, Depressed, Hopeless 1 1  PHQ - 2 Score 3 3  Altered sleeping - 3  Tired, decreased energy - 3  Change in appetite - 3  Feeling bad or failure about yourself  - 1  Trouble concentrating - 1  Moving slowly or fidgety/restless - 1  Suicidal thoughts - 0  PHQ-9 Score - 15  Difficult doing work/chores - Very difficult    Review of Systems  Neurological: Positive for dizziness.       Spasms  Psychiatric/Behavioral: Positive for dysphoric mood. The patient is nervous/anxious.        Objective:   Physical Exam  Web ex   Coccyx xray 04/22/20  IMPRESSION: 1. Amorphous calcification posterior to the distal sacrum. No visible coccyx. This could be due to prior infection, trauma, or surgery. I cannot exclude a neoplasm. 2. Has the patient had prior surgery on the coccyx? CT scan of the sacrum with contrast is recommended for further assessment.     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 With B/L heel, R ischial tuberosity and R posterior thigh pressure ulcers being followed by wound care.  Also has new C diff dx- on PO Vanc.    1. Needs CT of sacrum with contrast for further assessment. Ordered lumbar spine because no sacrum CT in computer, but detailed why I ordered.   2. No refills needed of pain meds right now; nor Baclofen, Dantrolene, Valium, etc.   3. Suggested asking PCP about  other diuretics, but sounds like she already has, and PCP doesn't have other ideas either.   4. Make sure don't get CT scan until C Diff treated.  5. F/U 6 weeks- double appointment  Spent a total of 30 minutes on appointment- more than 15 minutes discussing plan for bladder/kidneys and pain.

## 2020-05-08 ENCOUNTER — Other Ambulatory Visit: Payer: Self-pay | Admitting: Physical Medicine and Rehabilitation

## 2020-05-08 MED ORDER — OXYCODONE HCL 5 MG PO TABS: 5.0000 mg | ORAL_TABLET | Freq: Four times a day (QID) | ORAL | 0 refills | Status: DC | PRN

## 2020-05-23 ENCOUNTER — Ambulatory Visit (HOSPITAL_COMMUNITY)
Admission: RE | Admit: 2020-05-23 | Discharge: 2020-05-23 | Disposition: A | Discharge status: Home / Self Care | Payer: Medicare PPO | Source: Ambulatory Visit | Attending: Physical Medicine and Rehabilitation | Admitting: Physical Medicine and Rehabilitation

## 2020-05-23 ENCOUNTER — Other Ambulatory Visit: Payer: Self-pay

## 2020-05-23 DIAGNOSIS — M533 Sacrococcygeal disorders, not elsewhere classified: Secondary | ICD-10-CM | POA: Diagnosis present

## 2020-05-23 MED ORDER — IOHEXOL 300 MG/ML  SOLN
100.0000 mL | Freq: Once | INTRAMUSCULAR | Status: AC | PRN
  Administered 2020-05-23: 100 mL via INTRAVENOUS

## 2020-05-23 MED ORDER — SODIUM CHLORIDE (PF) 0.9 % IJ SOLN
INTRAMUSCULAR | Status: AC
  Filled 2020-05-23: qty 50

## 2020-06-06 ENCOUNTER — Encounter: Payer: Medicare PPO | Attending: Physical Medicine and Rehabilitation | Admitting: Physical Medicine and Rehabilitation

## 2020-06-06 ENCOUNTER — Other Ambulatory Visit: Payer: Self-pay

## 2020-06-06 ENCOUNTER — Encounter: Payer: Self-pay | Admitting: Physical Medicine and Rehabilitation

## 2020-06-06 VITALS — BP 112/76 | HR 91 | Temp 97.5°F | Ht 70.0 in | Wt 257.0 lb

## 2020-06-06 DIAGNOSIS — N319 Neuromuscular dysfunction of bladder, unspecified: Secondary | ICD-10-CM | POA: Diagnosis present

## 2020-06-06 DIAGNOSIS — G909 Disorder of the autonomic nervous system, unspecified: Secondary | ICD-10-CM

## 2020-06-06 DIAGNOSIS — Z5181 Encounter for therapeutic drug level monitoring: Secondary | ICD-10-CM | POA: Diagnosis present

## 2020-06-06 DIAGNOSIS — G8221 Paraplegia, complete: Secondary | ICD-10-CM | POA: Diagnosis present

## 2020-06-06 DIAGNOSIS — M533 Sacrococcygeal disorders, not elsewhere classified: Secondary | ICD-10-CM | POA: Diagnosis present

## 2020-06-06 DIAGNOSIS — M7918 Myalgia, other site: Secondary | ICD-10-CM

## 2020-06-06 DIAGNOSIS — G894 Chronic pain syndrome: Secondary | ICD-10-CM | POA: Diagnosis present

## 2020-06-06 DIAGNOSIS — Z79891 Long term (current) use of opiate analgesic: Secondary | ICD-10-CM | POA: Insufficient documentation

## 2020-06-06 DIAGNOSIS — R6889 Other general symptoms and signs: Secondary | ICD-10-CM | POA: Diagnosis not present

## 2020-06-06 MED ORDER — OXYCODONE HCL 5 MG PO TABS: 5.0000 mg | ORAL_TABLET | Freq: Four times a day (QID) | ORAL | 0 refills | Status: DC | PRN

## 2020-06-06 MED ORDER — ONDANSETRON HCL 8 MG PO TABS: 8.0000 mg | ORAL_TABLET | Freq: Three times a day (TID) | ORAL | 5 refills | Status: DC | PRN

## 2020-06-06 NOTE — Patient Instructions (Signed)
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 With B/L heel, R ischial tuberosity and R posterior thigh pressure ulcers being followed by wound care.  Also has new C diff dx- on PO Vanc.   Need to see either Family medicine, IM, ER and ID about cystic acne asap- could be Staph???  1. Can order TSH- to check because so cold all the time- and dry skin, etc- will check to see if that's the cause of cold issues  2. Changed to Oxycodone- over the phone- will refill #120 5 mg q6 hours prn.    3. Gave suggestions to get rid of Percocet-   4. Refilled Zofran 8 mg q8 hours prn- #90- 5 RFs  5. Rare use of Valium, doesn't need refills on baclofen/Dantrolene  6. Speak with Dr Ezzie Dural about CT scan results.   7. F/U in 8 weeks

## 2020-06-06 NOTE — Progress Notes (Signed)
Subjective:    Patient ID: Rachael Turner, female    DOB: Nov 05, 1971, 49 y.o.   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 With B/L heel, R ischial tuberosity and R posterior thigh pressure ulcers being followed by wound care.  Also has new C diff dx- on PO Vanc.   Started losing control of bacteria- intermittent diarrhea, huge pimples- urine cloudy- getting another UTI- only gets acne when gets UTI. Dry heaves, low grade temp, etc;  ID- called them- tried fosfomycin- q3 days- 2 doses-  Took it daily, not q3 days- taken 2 doses so far  Drove here without horrible pain- not quite as sick- didn't have diarrhea this AM.    "pimples" are huge and feels like hard/abscessing and only 1 draining  on on L buttock- opened up into sore- -they are  really deep.   C diff is better- diarrhea has resolved overall.  CT of sacrum shows new kidney stone 2-86mm- lower pole on L.   Taking more oxycodone- lately due to UTI/pain.      Last checked thyroid a few years ago- was fine.  Still extremely cold.        Pain Inventory Average Pain 6 Pain Right Now 4 My pain is other  In the last 24 hours, has pain interfered with the following? General activity 7 Relation with others 7 Enjoyment of life 9 What TIME of day is your pain at its worst? evening Sleep (in general) Fair  Pain is worse with: some activites Pain improves with: other Relief from Meds: 7  Mobility do you drive?  yes use a wheelchair transfers alone  Function disabled: date disabled 10/08/2007 retired I need assistance with the following:  bathing, toileting, meal prep, household duties and shopping  Neuro/Psych bowel control problems numbness tingling spasms  Prior Studies Any  changes since last visit?  no  Physicians involved in your care Any changes since last visit?  no   No family history on file. Social History   Socioeconomic History   Marital status: Unknown    Spouse name: Not on file   Number of children: Not on file   Years of education: Not on file   Highest education level: Not on file  Occupational History   Not on file  Tobacco Use   Smoking status: Never Smoker   Smokeless tobacco: Never Used  Vaping Use   Vaping Use: Never used  Substance and Sexual Activity   Alcohol use: Never   Drug use: Never   Sexual activity: Yes    Birth control/protection: Surgical  Other Topics Concern   Not on file  Social History Narrative   Not on file   Social Determinants of Health   Financial Resource Strain:    Difficulty of Paying Living Expenses:   Food Insecurity:    Worried About Programme researcher, broadcasting/film/video in the Last Year:    Barista in the Last Year:   Transportation Needs:    Freight forwarder (Medical):    Lack of Transportation (Non-Medical):   Physical Activity:    Days of Exercise per Week:    Minutes of Exercise per Session:   Stress:    Feeling of Stress :   Social Connections:    Frequency of Communication with  Friends and Family:    Frequency of Social Gatherings with Friends and Family:    Attends Religious Services:    Active Member of Clubs or Organizations:    Attends Archivist Meetings:    Marital Status:    No past surgical history on file. Past Medical History:  Diagnosis Date   Complication of anesthesia    Difficult intubation    BP 112/76    Pulse 91    Temp (!) 97.5 F (36.4 C)    Ht 5\' 10"  (1.778 m) Comment: reported though has not been measured while flat lately   Wt 269 lb (122 kg) Comment: last weight   SpO2 99%    BMI 38.60 kg/m   Opioid Risk Score:   Fall Risk Score:  `1  Depression screen PHQ 2/9  Depression screen [facility] 2/9 06/06/2020 10/08/2019  09/10/2019  Decreased Interest 2 2 2   Down, Depressed, Hopeless 1 1 1   PHQ - 2 Score 3 3 3   Altered sleeping - - 3  Tired, decreased energy - - 3  Change in appetite - - 3  Feeling bad or failure about yourself  - - 1  Trouble concentrating - - 1  Moving slowly or fidgety/restless - - 1  Suicidal thoughts - - 0  PHQ-9 Score - - 15  Difficult doing work/chores - - Very difficult    Review of Systems  Constitutional: Positive for appetite change and diaphoresis.  Eyes: Negative.   Respiratory: Negative.   Cardiovascular: Positive for leg swelling.  Gastrointestinal: Positive for abdominal pain, diarrhea, nausea and vomiting.  Endocrine: Negative.   Genitourinary: Negative.   Musculoskeletal:       Spasms  Skin: Positive for wound.  Allergic/Immunologic: Negative.   Neurological: Positive for numbness.       Tingling  Hematological: Bruises/bleeds easily.       Warfarin  Psychiatric/Behavioral: Positive for dysphoric mood.  All other systems reviewed and are negative.      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 With B/L heel, R ischial tuberosity and R posterior thigh pressure ulcers being followed by wound care.  Also has new C diff dx- on PO Vanc.   Need to see either Family medicine, IM, ER and ID about cystic acne asap- could be Staph???  1. Can order TSH- to check because so cold all the time- and dry skin, etc- will check to see if that's the cause of cold issues  2. Changed to Oxycodone- over the phone- will refill  #120 5 mg q6 hours prn.    3. Gave suggestions to get rid of Percocet-   4. Refilled Zofran 8 mg q8 hours prn- #90- 5 RFs  5. Rare use of Valium, doesn't need refills on baclofen/Dantrolene  6. Speak with Dr Sarita Bottom about CT scan results.   7. F/U in 8 weeks   I spent a total of 30 minutes on visit- as detailed above

## 2020-06-07 LAB — TSH: TSH: 1.83 u[IU]/mL (ref 0.450–4.500)

## 2020-06-09 ENCOUNTER — Encounter: Payer: Self-pay | Admitting: Physical Medicine and Rehabilitation

## 2020-07-25 MED ORDER — OXYCODONE HCL 5 MG PO TABS: 5.0000 mg | ORAL_TABLET | Freq: Four times a day (QID) | ORAL | 0 refills | Status: DC | PRN

## 2020-07-25 NOTE — Telephone Encounter (Signed)
Is due for oxycodone- and pt called- will run out before next appointment mid August, so will call in for her.   Oxycodone 5 mg q6 hours prn- #120- no RFs

## 2020-07-27 DIAGNOSIS — A0472 Enterocolitis due to Clostridium difficile, not specified as recurrent: Secondary | ICD-10-CM

## 2020-07-27 HISTORY — DX: Enterocolitis due to Clostridium difficile, not specified as recurrent: A04.72

## 2020-08-08 ENCOUNTER — Encounter: Payer: Medicare PPO | Attending: Physical Medicine and Rehabilitation | Admitting: Physical Medicine and Rehabilitation

## 2020-08-08 ENCOUNTER — Encounter: Payer: Self-pay | Admitting: Physical Medicine and Rehabilitation

## 2020-08-08 ENCOUNTER — Other Ambulatory Visit: Payer: Self-pay

## 2020-08-08 VITALS — Ht 70.0 in | Wt 257.0 lb

## 2020-08-08 DIAGNOSIS — N319 Neuromuscular dysfunction of bladder, unspecified: Secondary | ICD-10-CM

## 2020-08-08 DIAGNOSIS — R6889 Other general symptoms and signs: Secondary | ICD-10-CM | POA: Insufficient documentation

## 2020-08-08 DIAGNOSIS — G8221 Paraplegia, complete: Secondary | ICD-10-CM | POA: Diagnosis not present

## 2020-08-08 DIAGNOSIS — N390 Urinary tract infection, site not specified: Secondary | ICD-10-CM

## 2020-08-08 DIAGNOSIS — R252 Cramp and spasm: Secondary | ICD-10-CM | POA: Diagnosis not present

## 2020-08-08 DIAGNOSIS — M533 Sacrococcygeal disorders, not elsewhere classified: Secondary | ICD-10-CM | POA: Insufficient documentation

## 2020-08-08 DIAGNOSIS — R1011 Right upper quadrant pain: Secondary | ICD-10-CM

## 2020-08-08 DIAGNOSIS — Z79891 Long term (current) use of opiate analgesic: Secondary | ICD-10-CM | POA: Insufficient documentation

## 2020-08-08 DIAGNOSIS — G909 Disorder of the autonomic nervous system, unspecified: Secondary | ICD-10-CM | POA: Insufficient documentation

## 2020-08-08 DIAGNOSIS — Z5181 Encounter for therapeutic drug level monitoring: Secondary | ICD-10-CM | POA: Insufficient documentation

## 2020-08-08 DIAGNOSIS — G894 Chronic pain syndrome: Secondary | ICD-10-CM | POA: Insufficient documentation

## 2020-08-08 NOTE — Patient Instructions (Signed)
Pt is a 49 yr old female with T9 paraplegia, C Diff (+), frequent UTIs, autonomic dysfunction, Neurogenic bowel and bladder, spasticity and ileoconduit for bladder, multiple Stage IV pressure ulcers and heel pressure ulcers. On video conference for f/u.    1. Needs to have imaging to look at RUQ- due to referred pain.  PCP can help with referral.     2. Con't Oxycodone- as needed for autonomic dysfunction.    3. Zofran per PCP  4. con't baclofen and dantrolene- other meds per PCP.   5. F/U in 3 months- double visit.

## 2020-08-08 NOTE — Progress Notes (Signed)
Subjective:    Patient ID: Rachael Turner, female    DOB: [birth year], 49 y.o.   MRN: [id number]  HPI  Due to national recommendations of social distancing because of COVID 31, an audio/video tele-health visit is felt to be the most appropriate encounter for this patient at this time. See MyChart message from today for the patient's consent to a tele-health encounter with [facility]. This is a follow up tele-visit via Webex. The patient is at home. MD is at office.    Pt is a 49 yr old female with T9 paraplegia, C Diff (+), frequent UTIs, autonomic dysfunction, Neurogenic bowel and bladder, spasticity and ileoconduit for bladder, multiple Stage IV pressure ulcers and heel pressure ulcers. On video conference for f/u.   Wound VAC on ischial tuberosity- doing well otherwise.   Hasn't been feeling good. Severe abd cramps- diarrhea.  Wed- left in bed til 6:30pm- got dry- urine cloudy and gunky Now has C Diff (+) and new UTI- at least Proteus and something new she's never heard of before.   Embolization went great- results- uterus shrunk 50% and ovarian cyst has completely deflated.   Looked like had osteomyelitis over ischial tuberosity- but wound looked great, WBC was good, images looked horrible, but finally after 3 days figured out didn't have osteomyelitis.   Been having problems with several weeks- when sits upright completley- driving, or sitting up for dinner, gets really gassy- pain response- RUQ of back- really strong pain; refers between spine and scapula.    Also having R shoulder referred pain as well, worse with sitting upright as well.         Pain Inventory Average Pain 6 Pain Right Now 6 My pain is constant and quadriplegia  In the last 24 hours, has pain interfered with the following? General activity 8 Relation with others 5 Enjoyment of life 8 What TIME of day is your pain at its worst? evening Sleep (in general)  Fair  Pain is worse with: sitting upright Pain improves with: medication and lying back Relief from Meds: 5  History reviewed. No pertinent family history. Social History   Socioeconomic History  . Marital status: Unknown    Spouse name: Not on file  . Number of children: Not on file  . Years of education: Not on file  . Highest education level: Not on file  Occupational History  . Not on file  Tobacco Use  . Smoking status: Never Smoker  . Smokeless tobacco: Never Used  Vaping Use  . Vaping Use: Never used  Substance and Sexual Activity  . Alcohol use: Never  . Drug use: Never  . Sexual activity: Yes    Birth control/protection: Surgical  Other Topics Concern  . Not on file  Social History Narrative  . Not on file   Social Determinants of Health   Financial Resource Strain:   . Difficulty of Paying Living Expenses:   Food Insecurity:   . Worried About Programme researcher, broadcasting/film/video in the Last Year:   . Barista in the Last Year:   Transportation Needs:   . Freight forwarder (Medical):   Marland Kitchen Lack of Transportation (Non-Medical):   Physical Activity:   . Days of Exercise per Week:   . Minutes of Exercise per Session:   Stress:   . Feeling of Stress :   Social Connections:   . Frequency of Communication with Friends and Family:   . Frequency of  Social Gatherings with Friends and Family:   . Attends Religious Services:   . Active Member of Clubs or Organizations:   . Attends Banker Meetings:   Marland Kitchen Marital Status:    History reviewed. No pertinent surgical history. History reviewed. No pertinent surgical history. Past Medical History:  Diagnosis Date  . C. difficile diarrhea 07/2020  . Complication of anesthesia   . Difficult intubation    Ht 5\' 10"  (1.778 m)   Wt 257 lb (116.6 kg) Comment: Video visit- per pt  BMI 36.88 kg/m   Opioid Risk Score:   Fall Risk Score:  `1  Depression screen PHQ 2/9  Depression screen [facility] 2/9 08/08/2020  06/06/2020 10/08/2019 09/10/2019  Decreased Interest 3 2 2 2   Down, Depressed, Hopeless 3 1 1 1   PHQ - 2 Score 6 3 3 3   Altered sleeping - - - 3  Tired, decreased energy - - - 3  Change in appetite - - - 3  Feeling bad or failure about yourself  - - - 1  Trouble concentrating - - - 1  Moving slowly or fidgety/restless - - - 1  Suicidal thoughts - - - 0  PHQ-9 Score - - - 15  Difficult doing work/chores - - - Very difficult   Review of Systems  Constitutional: Negative.   HENT: Negative.   Eyes: Negative.   Respiratory: Negative.   Cardiovascular: Negative.   Gastrointestinal: Positive for diarrhea and nausea.  Endocrine: Negative.   Genitourinary: Negative.   Musculoskeletal: Positive for back pain and neck stiffness. Negative for neck pain.  Skin: Positive for wound.  Allergic/Immunologic: Negative.   Neurological: Positive for dizziness.  Hematological: Negative.   Psychiatric/Behavioral: Negative.        Objective:   Physical Exam  Wearing reading glasses on nose- in power w/c, NAD webex     Assessment & Plan:   Pt is a 49 yr old female with T9 paraplegia, C Diff (+), frequent UTIs, autonomic dysfunction, Neurogenic bowel and bladder, spasticity and ileoconduit for bladder, multiple Stage IV pressure ulcers and heel pressure ulcers. On video conference for f/u.    1. Needs to have imaging to look at RUQ- due to referred pain.  PCP can help with referral.     2. Con't Oxycodone- as needed for autonomic dysfunction.    3. Zofran per PCP  4. con't baclofen and dantrolene.   5. F/U in 3 months- double visit.   I spent a total of 40 minutes-  as detailed above.

## 2020-08-12 ENCOUNTER — Telehealth: Payer: Self-pay

## 2020-08-12 NOTE — Telephone Encounter (Signed)
  Dr. Chaya Jan office called:   Dr. Ilsa Iha will ordering images for Rachael Turner. Per patient you mention ordering abdominal studies also.  Would you like for Dr. Ilsa Iha to include you order? What studies do you need?    Call back ph# for Dr Ilsa Iha is  [phone] or (personal cell)  [phone].

## 2020-08-12 NOTE — Telephone Encounter (Signed)
Called and left message for Dr Ilsa Iha expalined wants either RUQ U/S vs Ct of abdomen if possible since referring pain to R shoulder, which is usually in SCI patients due to RUE/RLQ pain from some cause.

## 2020-10-03 IMAGING — CT CT L SPINE W/ CM
3 series · 9 of 33 positions shown, 11 images · IV contrast (omnipaque)
Comparison: Radiography 04/22/2020

CLINICAL DATA: Abnormal coccyx. Lump on physical examination.
Symptoms noted over the last year. Paraplegic at T7 level.

EXAM:
CT LUMBAR SPINE WITH CONTRAST
TECHNIQUE: Multidetector CT imaging of the lumbar spine was performed with
intravenous contrast administration.
CONTRAST:  100mL OMNIPAQUE IOHEXOL 300 MG/ML  SOLN

[Series 4: l spine soft · axial · 0.47mm/px · z∈[-433,-433]mm · 1 of 167 slices shown, 2 images]
[im 90/167  soft-tissue]
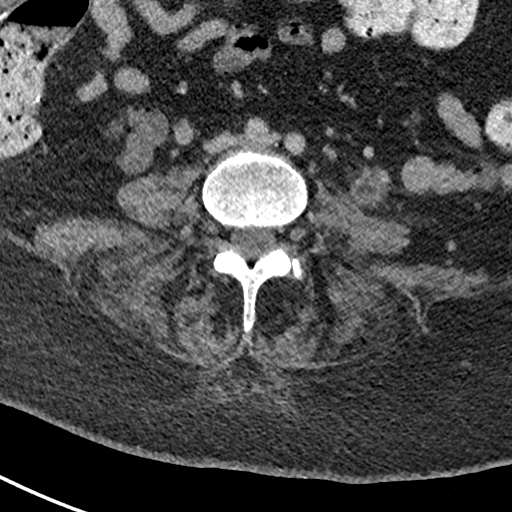
[im 90/167  bone]
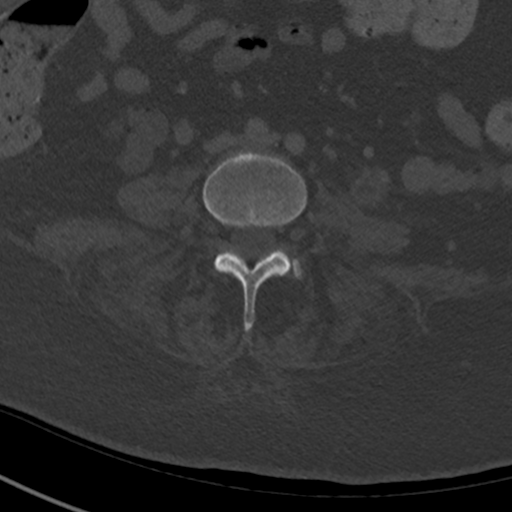

[Series 8: sagittal bone · sagittal · 0.46mm/px · 5 of 66 slices shown, 6 images]
[im 22/66  bone]
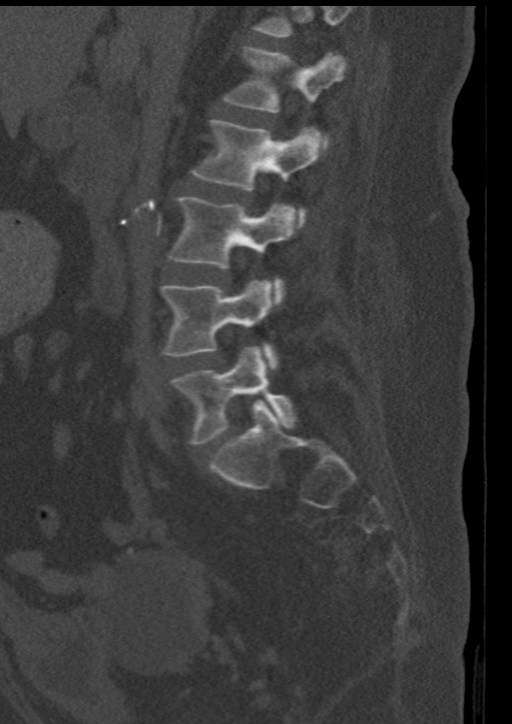
[im 28/66  bone]
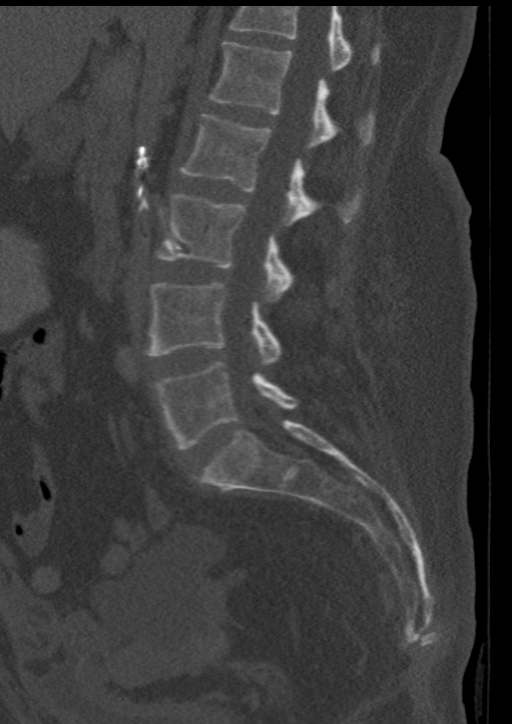
[im 33/66  soft-tissue]
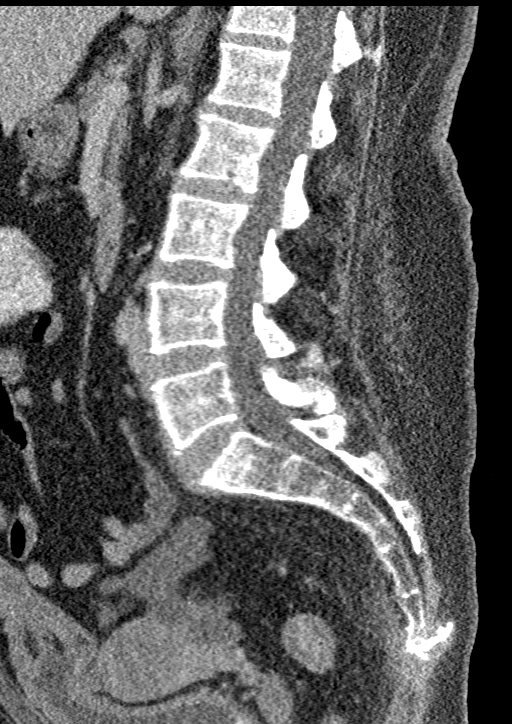
[im 33/66  bone]
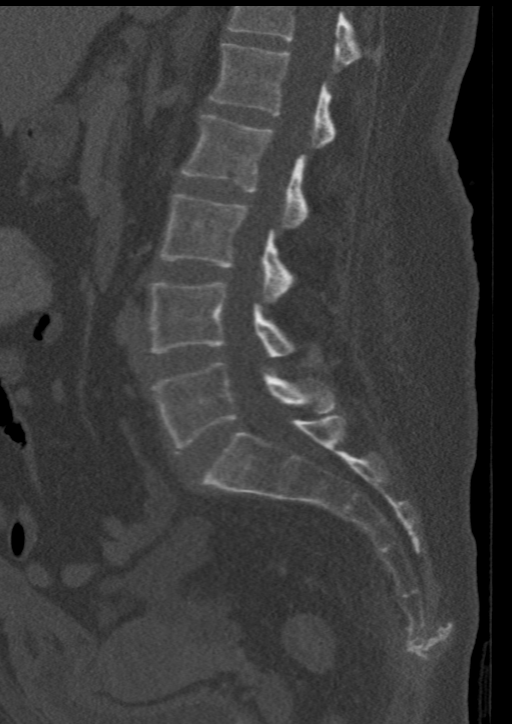
[im 38/66  bone]
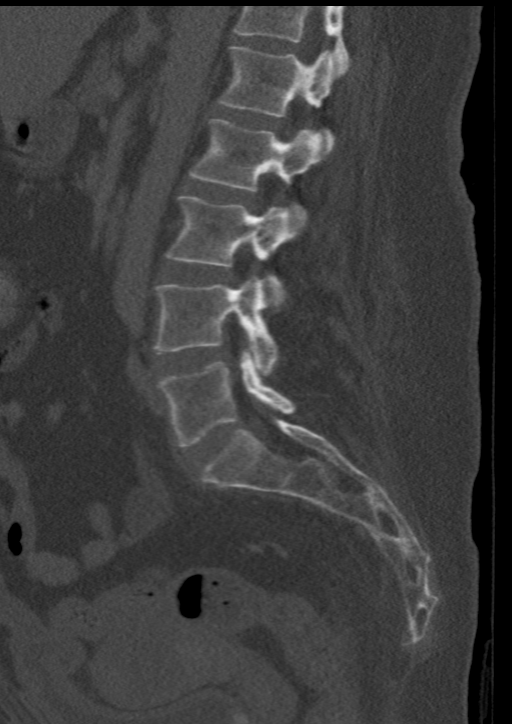
[im 44/66  bone]
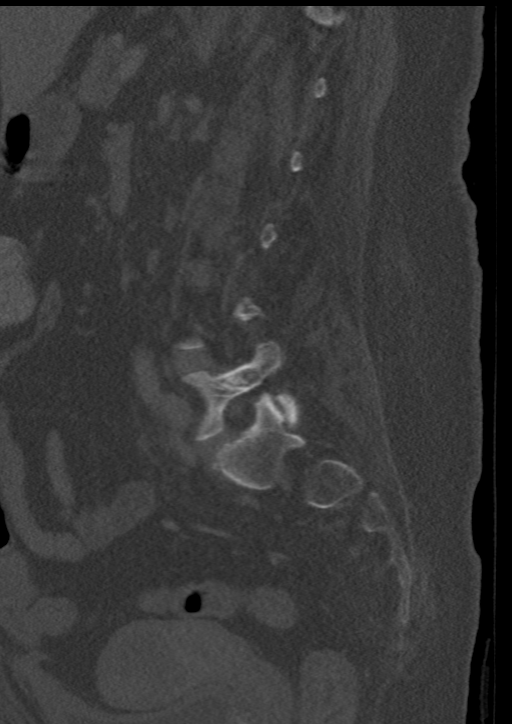

[Series 9: coronal bone · coronal · 0.26mm/px · 3 of 119 slices shown]
[im 24/119  bone]
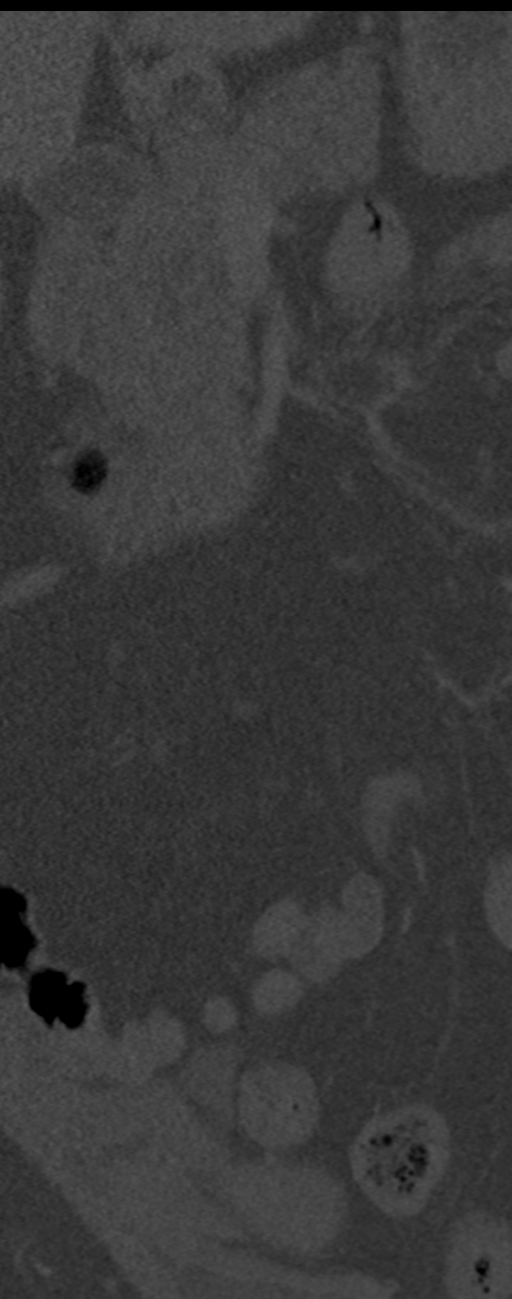
[im 48/119  bone]
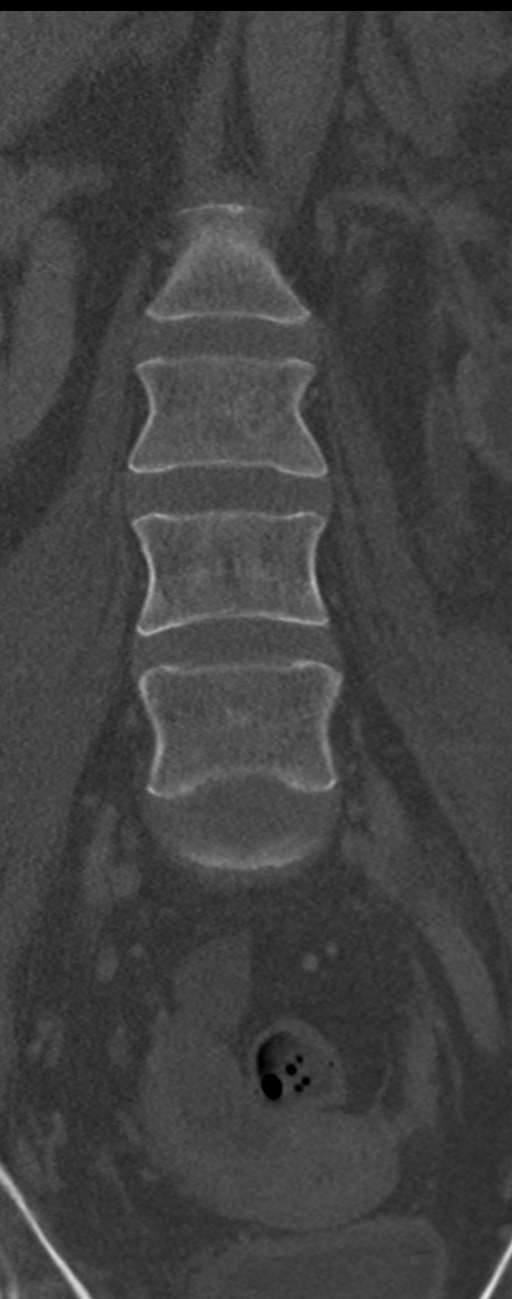
[im 71/119  bone]
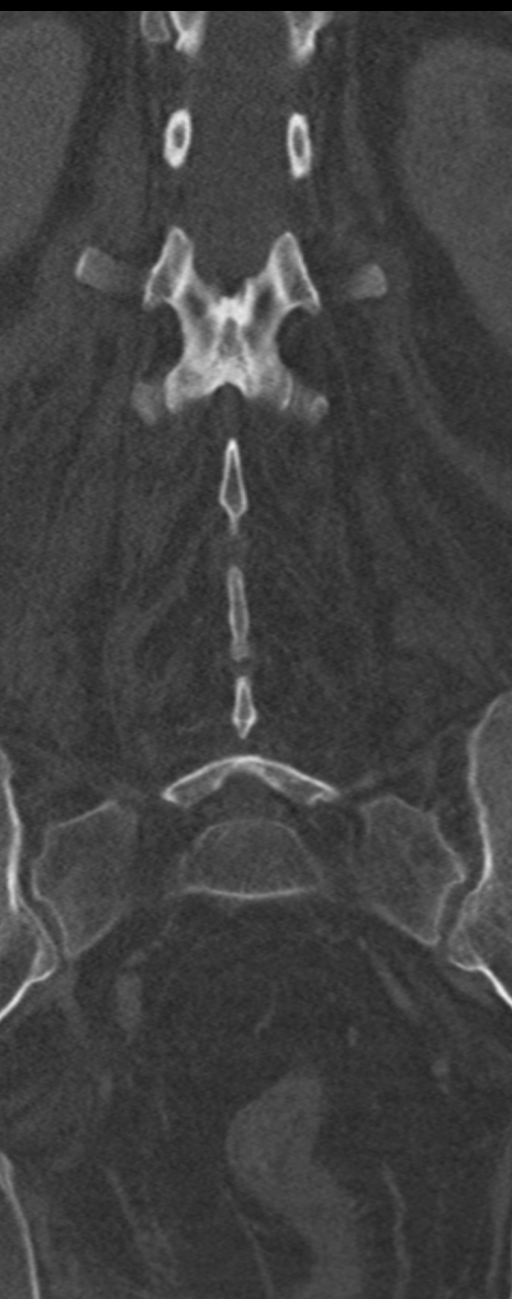

[9 of 33 positions shown; findings below may reference images not displayed]

FINDINGS: Segmentation: 5 lumbar type vertebral bodies. 5 sacral segments.
Abnormal coccyx as described below.

Alignment: Normal

Vertebrae: No lumbar vertebral body abnormality. Sacrum appears
normal from S1 through S5. There is an absence of a normal coccyx,
which should contain 3-5 coccygeal segments. There is some amorphous
bone type calcification at the expected site of the first coccygeal
segment. There is some mild soft tissue thickening surrounding that.
The appearance is most consistent with a chronic finding which could
be related to old trauma or osteomyelitis. I would doubt based on
this appearance that we are dealing with acute osteomyelitis, though
chronic osteomyelitis is not completely excluded. The appearance
does not look like neoplastic disease and there is no sign of any
neoplastic disease elsewhere affecting the bony structures.

Paraspinal and other soft tissues: IVC filter in place with strut
penetration. Right kidney appears normal. Left kidney shows
hydroureteronephrosis with poor contrast excretion compared to the
right. There appears to be a nonobstructing 2 or 3 mm stone in the
lower pole on the left. I do not see an obstructing ureteral stone.
There is an indistinct adnexal mass on the right which may relate to
the right ovary. This measures about 5.5 cm in diameter. Bladder is
thick-walled and contains some hyperdense material dependently that
probably represents bladder stones. There are surgical clips related
to bowel surgery in the right lower quadrant. Question if there is
some sort of urinary tract ostomy/neobladder. There are also
retroperitoneal surgical clips on the left pelvic region.

Disc levels: No disc level pathology. No stenosis or neural
compression.
IMPRESSION: Lumbar spine and 5 sacral segments appear normal. There is an
absence of a normal coccyx, with some amorphous calcification the
location of the first coccygeal segment. Findings probably relate to
old osteomyelitis or old trauma. Cannot completely rule out a degree
of ongoing chronic osteomyelitis but I do not favor that as there is
not much soft tissue thickening in the region. The appearance does
not look like neoplastic disease.

Hydroureteronephrosis on the left. Etiology indeterminate. Left
kidney does not function as well as the right. There are probably
some bladder stones dependent within the bladder.

Question 5 cm right adnexal mass.

If these ancillary findings are not known or explained clearly by
the history, next best test would probably be CT scan of the abdomen
and pelvis with contrast, though one could consider abdominal and
pelvic MRI with and without contrast.

## 2020-11-07 ENCOUNTER — Encounter: Payer: Self-pay | Admitting: Physical Medicine and Rehabilitation

## 2020-11-07 ENCOUNTER — Encounter: Payer: Medicare PPO | Attending: Physical Medicine and Rehabilitation | Admitting: Physical Medicine and Rehabilitation

## 2020-11-07 ENCOUNTER — Other Ambulatory Visit: Payer: Self-pay

## 2020-11-07 VITALS — BP 129/84 | HR 76 | Temp 97.8°F | Ht 70.0 in

## 2020-11-07 DIAGNOSIS — G8221 Paraplegia, complete: Secondary | ICD-10-CM

## 2020-11-07 DIAGNOSIS — M7918 Myalgia, other site: Secondary | ICD-10-CM | POA: Insufficient documentation

## 2020-11-07 DIAGNOSIS — M19041 Primary osteoarthritis, right hand: Secondary | ICD-10-CM

## 2020-11-07 DIAGNOSIS — R252 Cramp and spasm: Secondary | ICD-10-CM | POA: Diagnosis present

## 2020-11-07 DIAGNOSIS — M1811 Unilateral primary osteoarthritis of first carpometacarpal joint, right hand: Secondary | ICD-10-CM | POA: Insufficient documentation

## 2020-11-07 MED ORDER — BACLOFEN 20 MG PO TABS
40.0000 mg | ORAL_TABLET | Freq: Three times a day (TID) | ORAL | 3 refills | Status: DC
Start: 2020-11-07 — End: 2021-06-19

## 2020-11-07 MED ORDER — DANTROLENE SODIUM 100 MG PO CAPS: 100.0000 mg | ORAL_CAPSULE | Freq: Three times a day (TID) | ORAL | 3 refills | Status: DC

## 2020-11-07 NOTE — Patient Instructions (Addendum)
  Pt is a 49 yr old female with T9 paraplegia, C Diff (+), frequent UTIs, autonomic dysfunction, Neurogenic bowel and bladder, spasticity and ileoconduit for bladder, multiple Stage IV pressure ulcers and heel pressure ulcers. Here for f/u.    1. Can retry- cranberry pills, not cranberry juice- unless wants to.   2. Doesn't need Any oxycodone right now- has 67 pills left.   3. Wants Baclofen and dantrolene changed to 90 day Rx. Changed to 90 days rx with 3 rf's.   4. Doesn't have DeQuervain's based on (-) finkelstein's test, doesn't have significant carpal tunnel base don sensory exam and lack of (+) Tinel's sign; most likely, due to area of most pain, is [facility] arthritis. Getting xray to be clear.   5. Xray of hands on R only- looking at [facility] joint especially.   6. Can get B/L Episport tennis elbow braces. - wear with "C" laterally- towards elbow and velcro closing the capital D medially. Can get at [facility], or online.    7. Try trigger point hand myofascial release tools- to work on hands and trigger points overall. Esp to work on hand trigger points.   8. F/u 3 months- double appointment

## 2020-11-07 NOTE — Progress Notes (Signed)
Subjective:    Patient ID: Rachael Turner, female    DOB: 08-23-1971, 49 y.o.   MRN: [id number]  HPI    Pt is a 49 yr old female with T9 paraplegia, C Diff (+), frequent UTIs, autonomic dysfunction, Neurogenic bowel and bladder, spasticity and ileoconduit for bladder, multiple Stage IV pressure ulcers and heel pressure ulcers. Here for f/u.    Big thing- hydronephrosis of kidneys.   Finally, has nephrostomy tube from bad kidney- has noticed every time nephrostomy tube kinks up, urine looks really bad, felt real sick when kinked. When open, urine flowing well.    High counts of pseudomonas again from nephrostomy tube. Found she was dizzy, sick, felt really ill- last 2-3 days.  .  Supposed to get fosfomycin- ID doctors-  Today.    cathing so often, so having aching thumbs- thinking carpal tunnel syndrome-  PT -doing right- outpatient PT 2x/week They think something coming from neck and carpal tunnel? Driving drives the 1st 3 digits "crazy" -really painful.     Still has 67 oxycodone- so doing well- last Rx 07/25/20- had 120 prescribed.    Have bumped Baclofen to 30 mg 2x/day and 40 mg 1x/day usually, but good to have extra pills, if need to increase the dose- can increase it.     Pain Inventory Average Pain 4 Pain Right Now 7 My pain is constant, dull and aching  LOCATION OF PAIN Base of skull and top of neck, spine and right scapula  BOWEL Number of stools per week: 3 Oral laxative use Yes  Type of laxative Miralax Enema or suppository use Yes  History of colostomy No  Incontinent Yes   BLADDER Suprapubic In and out cath, frequency IN &N OUT by belly  button Able to self cath Yes  Bladder incontinence Yes  Frequent urination No  Leakage with coughing Yes  Difficulty starting stream No  Incomplete bladder emptying self caths   Mobility ability to climb steps?  no do you drive?  yes use a wheelchair needs help with transfers Do you have any goals in  this area?  yes  Function disabled: date disabled 2008 I need assistance with the following:  dressing, bathing and toileting Do you have any goals in this area?  yes  Neuro/Psych bowel control problems numbness tingling spasms dizziness  Prior Studies CT/MRI  Physicians involved in your care Any changes since last visit?  yes   History reviewed. No pertinent family history. Social History   Socioeconomic History  . Marital status: Unknown    Spouse name: Not on file  . Number of children: Not on file  . Years of education: Not on file  . Highest education level: Not on file  Occupational History  . Not on file  Tobacco Use  . Smoking status: Never Smoker  . Smokeless tobacco: Never Used  Vaping Use  . Vaping Use: Never used  Substance and Sexual Activity  . Alcohol use: Never  . Drug use: Never  . Sexual activity: Yes    Birth control/protection: Surgical  Other Topics Concern  . Not on file  Social History Narrative  . Not on file   Social Determinants of Health   Financial Resource Strain:   . Difficulty of Paying Living Expenses: Not on file  Food Insecurity:   . Worried About Programme researcher, broadcasting/film/video in the Last Year: Not on file  . Ran Out of Food in the Last Year: Not on file  Transportation  Needs:   . Lack of Transportation (Medical): Not on file  . Lack of Transportation (Non-Medical): Not on file  Physical Activity:   . Days of Exercise per Week: Not on file  . Minutes of Exercise per Session: Not on file  Stress:   . Feeling of Stress : Not on file  Social Connections:   . Frequency of Communication with Friends and Family: Not on file  . Frequency of Social Gatherings with Friends and Family: Not on file  . Attends Religious Services: Not on file  . Active Member of Clubs or Organizations: Not on file  . Attends Banker Meetings: Not on file  . Marital Status: Not on file   History reviewed. No pertinent surgical history. Past  Medical History:  Diagnosis Date  . C. difficile diarrhea 07/2020  . Complication of anesthesia   . Difficult intubation    BP 129/84   Pulse 76   Temp 97.8 F (36.6 C)   Ht 5\' 10"  (1.778 m)   SpO2 98%   BMI 36.88 kg/m   Opioid Risk Score:   Fall Risk Score:  `1  Depression screen PHQ 2/9  Depression screen [facility] 2/9 08/08/2020 06/06/2020 10/08/2019 09/10/2019  Decreased Interest 3 2 2 2   Down, Depressed, Hopeless 3 1 1 1   PHQ - 2 Score 6 3 3 3   Altered sleeping - - - 3  Tired, decreased energy - - - 3  Change in appetite - - - 3  Feeling bad or failure about yourself  - - - 1  Trouble concentrating - - - 1  Moving slowly or fidgety/restless - - - 1  Suicidal thoughts - - - 0  PHQ-9 Score - - - 15  Difficult doing work/chores - - - Very difficult   Review of Systems  Cardiovascular: Positive for leg swelling.  Gastrointestinal: Positive for constipation, diarrhea and nausea.  Musculoskeletal: Positive for back pain and gait problem.  All other systems reviewed and are negative.      Objective:   Physical Exam  Awake, alert, appropriate, in power wc, using reading eyeglasses, NAD Negative tinel's B/L No thenar eminence flattening B/L Finkelstein's test (-) on R No reduction in sensaiton to light touch in  1st, 2nd, 3rd, 4th or 5th digits B/L- no difference in 4th digit medially or laterally TTP over first [facility] joint on R hand Also has trigger point, not cause of pain between 1st/2nd digit rays- TTP but not THE cause of her current pain.        Assessment & Plan:     Pt is a 49 yr old female with T9 paraplegia, C Diff (+), frequent UTIs, autonomic dysfunction, Neurogenic bowel and bladder, spasticity and ileoconduit for bladder, multiple Stage IV pressure ulcers and heel pressure ulcers. Here for f/u.    1. Can retry- cranberry pills, not cranberry juice- unless wants to.   2. Doesn't need Any oxycodone right now- has 67 pills left.   3. Wants Baclofen and  dantrolene changed to 90 day Rx. Changed to 90 days rx with 3 rf's.   4. Doesn't have DeQuervain's base don (-) finkelstein's test, doesn't have significant carpal tunnel basedon sensory exam and lack of (+) Tinel's sign; most likely, due to area of most pain, is [facility]MC arthritis.   5. Xray of hands on R only- looking at [facility] joint especially.   6. Can get B/L Episport tennis elbow braces. - wear with "C" laterally- towards elbow and  velcro closing the capital D medially.   7. Try trigger point hand myofascial release tools- to work on hands and trigger points overall.   8. F/u 3 months- double appointment- SCI    I spent a total of 35 minutes on visit- as detailed above.

## 2020-11-18 ENCOUNTER — Other Ambulatory Visit: Payer: Self-pay

## 2020-11-18 ENCOUNTER — Ambulatory Visit (HOSPITAL_COMMUNITY)
Admission: RE | Admit: 2020-11-18 | Discharge: 2020-11-18 | Disposition: A | Discharge status: Home / Self Care | Payer: Medicare PPO | Source: Ambulatory Visit | Attending: Physical Medicine and Rehabilitation | Admitting: Physical Medicine and Rehabilitation

## 2020-11-18 DIAGNOSIS — M1811 Unilateral primary osteoarthritis of first carpometacarpal joint, right hand: Secondary | ICD-10-CM | POA: Insufficient documentation

## 2021-01-13 ENCOUNTER — Other Ambulatory Visit: Payer: Self-pay | Admitting: Physical Medicine and Rehabilitation

## 2021-02-06 ENCOUNTER — Encounter: Payer: Medicare PPO | Admitting: Physical Medicine and Rehabilitation

## 2021-02-13 ENCOUNTER — Other Ambulatory Visit: Payer: Self-pay | Admitting: Physical Medicine and Rehabilitation

## 2021-02-13 MED ORDER — OXYCODONE HCL 5 MG PO TABS: 5.0000 mg | ORAL_TABLET | Freq: Four times a day (QID) | ORAL | 0 refills | Status: DC | PRN

## 2021-02-13 MED ORDER — OXYCODONE-ACETAMINOPHEN 10-325 MG PO TABS
1.0000 | ORAL_TABLET | ORAL | 0 refills | Status: DC | PRN
Start: 2021-02-13 — End: 2021-03-20

## 2021-02-23 ENCOUNTER — Telehealth: Payer: Self-pay | Admitting: *Deleted

## 2021-02-23 ENCOUNTER — Encounter: Payer: Self-pay | Admitting: *Deleted

## 2021-02-23 NOTE — Telephone Encounter (Signed)
Opened in error.

## 2021-02-23 NOTE — Telephone Encounter (Addendum)
Prior Berkley Harvey was initiated for oxycodone 5 mg #120/30 days with [facility] via CoverMyMeds.  Approval was received 12/27/20-12/26/21.  Patient and pharmacy notified.

## 2021-03-20 ENCOUNTER — Other Ambulatory Visit: Payer: Self-pay

## 2021-03-20 ENCOUNTER — Encounter: Payer: Self-pay | Admitting: Physical Medicine and Rehabilitation

## 2021-03-20 ENCOUNTER — Encounter: Payer: Medicare PPO | Attending: Physical Medicine and Rehabilitation | Admitting: Physical Medicine and Rehabilitation

## 2021-03-20 VITALS — BP 105/71 | HR 82 | Temp 98.6°F | Ht 70.0 in

## 2021-03-20 DIAGNOSIS — G909 Disorder of the autonomic nervous system, unspecified: Secondary | ICD-10-CM

## 2021-03-20 DIAGNOSIS — G8221 Paraplegia, complete: Secondary | ICD-10-CM

## 2021-03-20 DIAGNOSIS — R252 Cramp and spasm: Secondary | ICD-10-CM

## 2021-03-20 DIAGNOSIS — N319 Neuromuscular dysfunction of bladder, unspecified: Secondary | ICD-10-CM
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
## 2021-03-20 NOTE — Patient Instructions (Signed)
  Pt is a 50 yr old female with T7 paraplegia, C Diff (+), frequent UTIs, autonomic dysfunction, Neurogenic bowel and bladder, spasticity and ileoconduit for bladder, multiple Stage IV pressure ulcers and heel pressure ulcers. Here for f/u.  For f/u on paraplegia and spasticity. Still has chronic osteomyelitis.  Also has IBS.  Poor nutrition.   1. Don't try to restrict diet when trying to heal wound.   2. Double protein in coconut, Soy, or almond milk. Also nuts. Eggs?   3. Doing well- don't need refills today- is a Acupuncturist on Dantrolene right now.   4. Con't Valium prn and Baclofen 40 mg at night and 30 mg  AM and PM, Dantrolene- 100 mg TID.   5. Con't Percocet- has #125- hasn't taken much in last 2-3 weeks. Doesn't need refill right now  6.  F/U in 3 months- due to multiple medical issues.

## 2021-04-08 MED ORDER — DIAZEPAM 5 MG PO TABS: 5.0000 mg | ORAL_TABLET | Freq: Two times a day (BID) | ORAL | 5 refills | Status: DC | PRN

## 2021-06-19 ENCOUNTER — Other Ambulatory Visit: Payer: Self-pay

## 2021-06-19 ENCOUNTER — Encounter: Payer: Self-pay | Admitting: Physical Medicine and Rehabilitation

## 2021-06-19 ENCOUNTER — Encounter: Payer: Medicare PPO | Attending: Physical Medicine and Rehabilitation | Admitting: Physical Medicine and Rehabilitation

## 2021-06-19 VITALS — BP 102/71 | HR 87 | Temp 99.2°F | Ht 70.0 in

## 2021-06-19 DIAGNOSIS — G8221 Paraplegia, complete: Secondary | ICD-10-CM

## 2021-06-19 DIAGNOSIS — Z79891 Long term (current) use of opiate analgesic: Secondary | ICD-10-CM | POA: Diagnosis present

## 2021-06-19 DIAGNOSIS — M7918 Myalgia, other site: Secondary | ICD-10-CM | POA: Diagnosis present

## 2021-06-19 DIAGNOSIS — R252 Cramp and spasm: Secondary | ICD-10-CM

## 2021-06-19 DIAGNOSIS — G894 Chronic pain syndrome: Secondary | ICD-10-CM | POA: Diagnosis present

## 2021-06-19 DIAGNOSIS — L89319 Pressure ulcer of right buttock, unspecified stage: Secondary | ICD-10-CM | POA: Diagnosis present

## 2021-06-19 DIAGNOSIS — Z5181 Encounter for therapeutic drug level monitoring: Secondary | ICD-10-CM | POA: Insufficient documentation

## 2021-06-19 MED ORDER — DANTROLENE SODIUM 100 MG PO CAPS: 100.0000 mg | ORAL_CAPSULE | Freq: Three times a day (TID) | ORAL | 3 refills | Status: DC

## 2021-06-19 MED ORDER — DIAZEPAM 5 MG PO TABS: 5.0000 mg | ORAL_TABLET | Freq: Two times a day (BID) | ORAL | 5 refills | Status: DC | PRN

## 2021-06-19 MED ORDER — BACLOFEN 20 MG PO TABS: 40.0000 mg | ORAL_TABLET | Freq: Three times a day (TID) | ORAL | 3 refills | Status: DC

## 2021-06-19 NOTE — Progress Notes (Signed)
Subjective:    Patient ID: Rachael Turner, female    DOB: 1971-12-08, 50 y.o.   MRN: [id number]  HPI   Pt is a 50 yr old female with T7 paraplegia, C Diff (+), frequent UTIs, autonomic dysfunction, Neurogenic bowel and bladder, spasticity and ileoconduit for bladder, multiple Stage IV pressure ulcers and heel pressure ulcers. Here for f/u.  For f/u on paraplegia and spasticity. Still has chronic osteomyelitis. Also has IBS. Poor nutrition F/U on SCI/paraplegia.    Is doing pressure relief- q15 minutes- sets alarm at 13 minutes.   Main wound is shrinking by 2-3 mm every 1-2 weeks.  PICO device- placed for tunneling- another negative pressure device- only works for depth of 4cm.  Packing side tunnel with bacteriostatic material.   Working on nutrition- has gained 30 lbs in last few months- was 267 lbs (took 2-3 years to get there)- now at 297 lbs on operating table- clothes don't fit right.  Vit C bothers her stomach- even light dropper full sets off stomach.  Thinking about Juven.  24 days was $187.  Thinks has gained weight even since then-  Has to look at protein and calories.  R ischial tuberosity is her only wound now.Protrudes more than left side.     Whole life is spreadsheets with pressure relief, intake and cathing  Had L kidney removal- end of May 2022- doing wonderfully now- first couple of weeks, felt not great- urine is so much bett  Hasn't had UTI's since then - been 1 month.  Spasticity has been up last few weeks. Has discovered new use for Valium- Doesn't take often-  It's abetter muscle relaxer- then Flexeril- can avoid using Imitrex shots a few  (3x) times- if can take BC powder and valium and stretches-      Pain Inventory Average Pain 5 Pain Right Now 6 My pain is intermittent, constant, dull, aching, and beginning to have a migraine  LOCATION OF PAIN  head currently, neck and back  BOWEL Number of stools per week: 3 Oral laxative use Yes   Type of laxative magic bullet / miralax Enema or suppository use Yes  History of colostomy No  Incontinent No   BLADDER Ileal conduit In and out cath, frequency q 4 hours Able to self cath Yes  Bladder incontinence No  Frequent urination No  Leakage with coughing No  Difficulty starting stream Yes  Incomplete bladder emptying Yes    Mobility do you drive?  yes use a wheelchair transfers alone  Function retired I need assistance with the following:  dressing, bathing, toileting, and household duties  Neuro/Psych bowel control problems spasms dizziness  Prior Studies Any changes since last visit?  no  Physicians involved in your care Any changes since last visit?  no   No family history on file. Social History   Socioeconomic History   Marital status: Married    Spouse name: Not on file   Number of children: Not on file   Years of education: Not on file   Highest education level: Not on file  Occupational History   Not on file  Tobacco Use   Smoking status: Never   Smokeless tobacco: Never  Vaping Use   Vaping Use: Never used  Substance and Sexual Activity   Alcohol use: Not Currently    Comment: rare   Drug use: Never   Sexual activity: Yes    Birth control/protection: Surgical  Other Topics Concern   Not on file  Social History Narrative   Not on file   Social Determinants of Health   Financial Resource Strain: Not on file  Food Insecurity: Not on file  Transportation Needs: Not on file  Physical Activity: Not on file  Stress: Not on file  Social Connections: Not on file   No past surgical history on file. Past Medical History:  Diagnosis Date   C. difficile diarrhea 07/2020   Complication of anesthesia    Difficult intubation    BP 102/71   Pulse 87   Temp 99.2 F (37.3 C)   Ht 5\' 10"  (1.778 m)   SpO2 99%   BMI 36.88 kg/m   Opioid Risk Score:   Fall Risk Score:  `1  Depression screen PHQ 2/9  Depression screen [facility] 2/9  03/20/2021 11/07/2020 08/08/2020 06/06/2020 10/08/2019 09/10/2019  Decreased Interest 1 1 3 2 2 2   Down, Depressed, Hopeless 1 1 3 1 1 1   PHQ - 2 Score 2 2 6 3 3 3   Altered sleeping - - - - - 3  Tired, decreased energy - - - - - 3  Change in appetite - - - - - 3  Feeling bad or failure about yourself  - - - - - 1  Trouble concentrating - - - - - 1  Moving slowly or fidgety/restless - - - - - 1  Suicidal thoughts - - - - - 0  PHQ-9 Score - - - - - 15  Difficult doing work/chores - - - - - Very difficult     Review of Systems  Constitutional:  Positive for unexpected weight change.       Weight gain reported  HENT: Negative.    Eyes: Negative.   Respiratory: Negative.    Cardiovascular:  Positive for leg swelling.  Gastrointestinal:  Positive for abdominal pain, constipation, diarrhea, nausea and vomiting.  Endocrine: Negative.   Genitourinary:        Self cath  Musculoskeletal:  Positive for back pain and neck pain.       Spasms  Skin:  Positive for wound.  Allergic/Immunologic: Negative.   Neurological:  Positive for dizziness and headaches.  Hematological: Negative.   Psychiatric/Behavioral: Negative.    All other systems reviewed and are negative.     Objective:   Physical Exam Awake, alert, appropriate, wearing real eyeglasses, in power w/c- joystick on right, NAD Doing pressure relief q15 minutes Spasticity- MAS of 2 in LE's B/L MS: 0/5 in LE's; 5/5 in UE's.        Assessment & Plan:   Pt is a 50 yr old female with T7 paraplegia, C Diff (+), frequent UTIs, autonomic dysfunction, Neurogenic bowel and bladder, spasticity and ileoconduit for bladder, multiple Stage IV pressure ulcers and heel pressure ulcers. Here for f/u.  For f/u on paraplegia and spasticity. Still has chronic osteomyelitis. Also has IBS. Poor nutrition Here for f/u on paraplegia S/p L kidney removal 5/22.    Last Oxycodone refill was 2/22-  Has 87 pills left- hasn't used in >2 weeks- will  wait on refill again-  Con't Valium prn - needs refill-since taking as needed-  and Baclofen 30 mg Am and afternoon and 40 mg QHS- needs refill.  Con't Dantrolene 100 mg TID-needs refill Con't pressure relief and do q15 minutes- ideally, lay all the way back to get pressure off wound.  S/P L kidney removal- per Urology.  F/U- 3 months- double appointment.    I spent a total of 28  minutes on visit- discussing L kidney removal and plan for valium- not increasing dose, but taking more than normal (I.e now 1x/day, vs 1x qweek).  Also discussed osteoporosis.  And related to SCI.

## 2021-06-19 NOTE — Patient Instructions (Signed)
Pt is a 50 yr old female with T7 paraplegia, C Diff (+), frequent UTIs, autonomic dysfunction, Neurogenic bowel and bladder, spasticity and ileoconduit for bladder, multiple Stage IV pressure ulcers and heel pressure ulcers. Here for f/u.  For f/u on paraplegia and spasticity. Still has chronic osteomyelitis. Also has IBS. Poor nutrition Here for f/u on paraplegia S/p L kidney removal 5/22.    Last Oxycodone refill was 2/22-  Has 87 pills left- hasn't used in >2 weeks- will wait on refill again-  Con't Valium prn - needs refill-since taking as needed-  and Baclofen 30 mg Am and afternoon and 40 mg QHS- needs refill.  Con't Dantrolene 100 mg TID-needs refill Con't pressure relief and do q15 minutes- ideally, lay all the way back to get pressure off wound.  S/P L kidney removal- per Urology.  F/U- 3 months- double appointment.

## 2021-09-04 ENCOUNTER — Encounter: Payer: Medicare PPO | Admitting: Physical Medicine and Rehabilitation

## 2021-11-06 ENCOUNTER — Other Ambulatory Visit: Payer: Self-pay

## 2021-11-06 ENCOUNTER — Encounter: Payer: Medicare PPO | Attending: Physical Medicine and Rehabilitation | Admitting: Physical Medicine and Rehabilitation

## 2021-11-06 ENCOUNTER — Encounter: Payer: Self-pay | Admitting: Physical Medicine and Rehabilitation

## 2021-11-06 VITALS — BP 108/73 | HR 81 | Ht 70.0 in | Wt 310.0 lb

## 2021-11-06 DIAGNOSIS — G8221 Paraplegia, complete: Secondary | ICD-10-CM

## 2021-11-06 DIAGNOSIS — N319 Neuromuscular dysfunction of bladder, unspecified: Secondary | ICD-10-CM | POA: Diagnosis not present

## 2021-11-06 DIAGNOSIS — G43119 Migraine with aura, intractable, without status migrainosus: Secondary | ICD-10-CM | POA: Diagnosis present

## 2021-11-06 DIAGNOSIS — R252 Cramp and spasm: Secondary | ICD-10-CM

## 2021-11-06 DIAGNOSIS — G909 Disorder of the autonomic nervous system, unspecified: Secondary | ICD-10-CM

## 2021-11-06 MED ORDER — TIZANIDINE HCL 4 MG PO TABS: 4.0000 mg | ORAL_TABLET | Freq: Four times a day (QID) | ORAL | 1 refills | Status: DC | PRN

## 2021-11-06 NOTE — Progress Notes (Signed)
Subjective:    Patient ID: Rachael Turner, female    DOB: 1971/12/19, 50 y.o.   MRN: [id number]  HPI   Pt is a 50 yr old female with T7 paraplegia,  hx of stroke- no weakness- but has slurred speech sometimes; Visual issues due to stroke; C Diff (+), frequent UTIs, autonomic dysfunction, Neurogenic bowel and bladder, spasticity and ileoconduit for bladder, multiple Stage IV pressure ulcers and heel pressure ulcers. Here for f/u.  For f/u on paraplegia and spasticity. Still has chronic osteomyelitis. Also has IBS. 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   Pt here for f/u on paraplegia and associated issues.    Had her first series of returning UTI's- went from May to September without one- and now has 3 back to back.   Has started preventatives- has methanamine,   Having autonomic dysfunction- and setting off migraines-  Can't get at the source-  When taking ABX- sets off IBS overall- which sets off back pain, which sets off migraines. On one for skin infection- has cyst on back that's infected, then will excise it.  Now open and draining- happy will be excised soon.   Forgot to bring Oxy- thinks has 40 or so pills left.  Sometimes Oxy helps with back pain- but not a lot- and has tried- Valium which also helps but doesn't want to take- explained would rather her take [facility] powder helps much better than anything. And doesn't take 1x/day for several days when it yo-yo's.   Still having migraines even with Imetrex! All this triggered by Abd pain.  Having  7-8 migraines - but affected 10 days/month and "isn't bad" for her.  Neuro has prescribed Ubrevly- hasn't picked it up yet.  Also gets Botox- in December- gets q 1months- wears off after 8 weeks.   Had ID appt coming up Valium makes her sleepy/stupid- has tried 1/2 dose- will actually slur speech.  Helps for some spasms, but not all of them-    Weight is still an issue-and thinks gaining weight still.  Due to protein for  healing- wound healing. Is shrinking some; Was 4 cm depth and depth to 6 cm on tunneling.  Current wound diameter 1 cm x 1.2 cm.  Now down to 3.5 cm depth, even with tunnel.      Pain Inventory Average Pain 5 Pain Right Now 5 My pain is dull and aching  LOCATION OF PAIN  head, neck, shoulder, wrist, hand, back  BOWEL Number of stools per week: 3 Oral laxative use Yes  Type of laxative miralax Enema or suppository use Yes  History of colostomy No  Incontinent Yes   BLADDER Ileal conduit In and out cath, frequency 3-4 hours Able to self cath Yes  Bladder incontinence No  Frequent urination Yes  Leakage with coughing No  Difficulty starting stream Yes  Incomplete bladder emptying Yes    Mobility how many minutes can you walk? 0 ability to climb steps?  no do you drive?  yes use a wheelchair needs help with transfers transfers alone  Function not employed: date last employed 05/2009 disabled: date disabled 09/2007 retired I need assistance with the following:  dressing, bathing, toileting, and household duties  Neuro/Psych bowel control problems numbness tingling trouble walking spasms dizziness  Prior Studies Any changes since last visit?  no  Physicians involved in your care Any changes since last visit?  no   No family history on file. Social History   Socioeconomic History  Marital status: Married    Spouse name: Not on file   Number of children: Not on file   Years of education: Not on file   Highest education level: Not on file  Occupational History   Not on file  Tobacco Use   Smoking status: Never   Smokeless tobacco: Never  Vaping Use   Vaping Use: Never used  Substance and Sexual Activity   Alcohol use: Not Currently    Comment: rare   Drug use: Never   Sexual activity: Yes    Birth control/protection: Surgical  Other Topics Concern   Not on file  Social History Narrative   Not on file   Social Determinants of Health    Financial Resource Strain: Not on file  Food Insecurity: Not on file  Transportation Needs: Not on file  Physical Activity: Not on file  Stress: Not on file  Social Connections: Not on file   No past surgical history on file. Past Medical History:  Diagnosis Date   C. difficile diarrhea 07/2020   Complication of anesthesia    Difficult intubation    BP 108/73   Pulse 81   Ht 5\' 10"  (1.778 m)   Wt (!) 310 lb (140.6 kg) Comment: pt reported  SpO2 99%   BMI 44.48 kg/m   Opioid Risk Score:   Fall Risk Score:  `1  Depression screen PHQ 2/9  Depression screen [facility] 2/9 03/20/2021 11/07/2020 08/08/2020 06/06/2020 10/08/2019 09/10/2019  Decreased Interest 1 1 3 2 2 2   Down, Depressed, Hopeless 1 1 3 1 1 1   PHQ - 2 Score 2 2 6 3 3 3   Altered sleeping - - - - - 3  Tired, decreased energy - - - - - 3  Change in appetite - - - - - 3  Feeling bad or failure about yourself  - - - - - 1  Trouble concentrating - - - - - 1  Moving slowly or fidgety/restless - - - - - 1  Suicidal thoughts - - - - - 0  PHQ-9 Score - - - - - 15  Difficult doing work/chores - - - - - Very difficult      Review of Systems  Musculoskeletal:  Positive for back pain and neck pain.       Shoulder pain  All other systems reviewed and are negative.     Objective:   Physical Exam Awake, alert, appropriate, BMI 44.5, in power w/c; doing pressure relief when I entered room- almost upside down, NAD Lateral knee pads are too tight on her legs- concerned and d/w pt.  Neuro: MAS of 2 in LE's B/L -        Assessment & Plan:   Pt is a 50 yr old female with T7 paraplegia, C Diff (+), frequent UTIs, autonomic dysfunction, Neurogenic bowel and bladder, spasticity and ileoconduit for bladder, multiple Stage IV pressure ulcers and heel pressure ulcers. Here for f/u.  For f/u on paraplegia and spasticity. Still has chronic osteomyelitis. Also has IBS. 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    Suggest  getting Ubrevly-take it! For migraines. If that doesn't work, ask Neuro for !  2. DON'T take BC powders- can affect kidneys long term- don't want you on dialysis.    3. Needs to get CMP- for being on Dantrolene-PCP will order per pt.     4. Need to adjust lateral knee pads- outwards- too tight.   5. Con't Baclofen 30 mg  in AM and PM and 40 mg QHS- - needs refill.    6. Con't Dantrolene 100 mg TID- for spasticity. Get CMP as above.    7. Con't pressure relief- as directed q15 minutes.  For stage IV pressure ulcers.    8.  Zanaflex- less sedating than Valium- can try Flexeril for muscle tightness, however will try Zanaflex-  2-4 mg  (1 tab is 4 mg) up to 4x/day as needed for muscle spasms not treated with Flexeril.    9. Has tried trigger point injections- not from me- but didn't help. Theracane doesn't work for more than a few seconds.   10. I suggest Amovig or Emgality along with Botox- but I think it's worth a try to treat myofascial pain/and prevent migraines- I have found it makes a difference for both.   11. I also suggest botox more of Upper traps, levators and even upper rhomboids because that can also help prevent migraines better as well.  Have pt to d/w Neurology. FNP- will need Neurologist to show FNP what muscles can focus on in addition to regular migraine points.   12.  Will schedule trigger point injections.  If you cannot get what I'm asking for.   13. F/U in 3 months- double visit- TrP injections and f/u on SCI -call me if DON'T want the trigger point injections-   14. Call me when needs more Oxycodone- not percocet!   I spent a total of 47 minutes on total visit- as detailed above.

## 2021-11-06 NOTE — Patient Instructions (Signed)
Pt is a 50 yr old female with T7 paraplegia, C Diff (+), frequent UTIs, autonomic dysfunction, Neurogenic bowel and bladder, spasticity and ileoconduit for bladder, multiple Stage IV pressure ulcers and heel pressure ulcers. Here for f/u.  For f/u on paraplegia and spasticity. Still has chronic osteomyelitis. Also has IBS. 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    Suggest getting Ubrevly-take it! For migraines. If that doesn't work, ask Neuro for [employer]	[place]!  2. DON'T take BC powders- can affect kidneys long term- don't want you on dialysis.    3. Needs to get CMP- for being on Dantrolene-PCP will order per pt.     4. Need to adjust lateral knee pads- outwards- too tight.   5. Con't Baclofen 30 mg in AM and PM and 40 mg QHS- - needs refill.    6. Con't Dantrolene 100 mg TID- for spasticity. Get CMP as above.    7. Con't pressure relief- as directed q15 minutes.  For stage IV pressure ulcers.    8.  Zanaflex- less sedating than Valium- can try Flexeril for muscle tightness, however will try Zanaflex-  2-4 mg  (1 tab is 4 mg) up to 4x/day as needed for muscle spasms not treated with Flexeril.    9. Has tried trigger point injections- not from me- but didn't help. Theracane doesn't work for more than a few seconds.   10. I suggest Amovig or Emgality along with Botox- but I think it's worth a try to treat myofascial pain/and prevent migraines- I have found it makes a difference for both.   11. I also suggest botox more of Upper traps, levators and even upper rhomboids because that can also help prevent migraines better as well.  Have pt to d/w Neurology. FNP- will need Neurologist to show FNP what muscles can focus on in addition to regular migraine points.   12.  Will schedule trigger point injections.  If you cannot get what I'm asking for.   13. F/U in 3 months- double visit- TrP injections and f/u on SCI -call me if DON'T want the trigger point injections-   14. Call me when  needs more Oxycodone- not percocet!

## 2021-11-26 ENCOUNTER — Encounter: Payer: Self-pay | Admitting: Physical Medicine and Rehabilitation

## 2021-11-27 ENCOUNTER — Telehealth: Payer: Self-pay

## 2021-11-27 MED ORDER — OXYCODONE HCL 5 MG PO TABS: 5.0000 mg | ORAL_TABLET | Freq: Four times a day (QID) | ORAL | 0 refills | Status: DC | PRN

## 2021-11-27 NOTE — Telephone Encounter (Signed)
Kelly from [employer] called stating that patient was there to pick up 3 different muscle relaxer and was Dr. Berline Chough aware. I called pharmacy back and according to last note, that is correct.  Last note 11/06/21 Con't Baclofen 30 mg in AM and PM and 40 mg QHS Con't Dantrolene 100 mg TID- for spasticity Zanaflex-  2-4 mg  (1 tab is 4 mg) up to 4x/day as needed for muscle spasms

## 2021-12-23 ENCOUNTER — Telehealth: Payer: Self-pay

## 2021-12-23 NOTE — Telephone Encounter (Signed)
Tizanidine PA sent to insurance

## 2021-12-24 NOTE — Telephone Encounter (Signed)
Tizanidine approved through 12/26/2022

## 2022-02-12 ENCOUNTER — Encounter: Payer: Self-pay | Admitting: Physical Medicine and Rehabilitation

## 2022-02-12 ENCOUNTER — Encounter: Payer: Medicare PPO | Attending: Physical Medicine and Rehabilitation | Admitting: Physical Medicine and Rehabilitation

## 2022-02-12 ENCOUNTER — Encounter: Payer: Medicare PPO | Admitting: Physical Medicine and Rehabilitation

## 2022-02-12 ENCOUNTER — Other Ambulatory Visit: Payer: Self-pay

## 2022-02-12 VITALS — BP 141/90 | HR 84 | Ht 70.0 in

## 2022-02-12 DIAGNOSIS — G8221 Paraplegia, complete: Secondary | ICD-10-CM | POA: Insufficient documentation

## 2022-02-12 DIAGNOSIS — G43119 Migraine with aura, intractable, without status migrainosus: Secondary | ICD-10-CM | POA: Diagnosis present

## 2022-02-12 DIAGNOSIS — G894 Chronic pain syndrome: Secondary | ICD-10-CM | POA: Insufficient documentation

## 2022-02-12 DIAGNOSIS — G909 Disorder of the autonomic nervous system, unspecified: Secondary | ICD-10-CM | POA: Insufficient documentation

## 2022-02-12 DIAGNOSIS — L89314 Pressure ulcer of right buttock, stage 4: Secondary | ICD-10-CM | POA: Insufficient documentation

## 2022-02-12 NOTE — Progress Notes (Signed)
Pt is a 51 yr old female with T7 paraplegia, C Diff (+), frequent UTIs, autonomic dysfunction, Neurogenic bowel and bladder, spasticity and ileoconduit for bladder, multiple Stage IV pressure ulcers and heel pressure ulcers. Here for f/u.  For f/u on paraplegia and spasticity. Still has chronic osteomyelitis. Also has IBS. 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  first time in a long time- chair went backwards too far.  Having more pain-  Took pain meds this AM- but hasn't taken a lot lately until fall.   Zanaflex- hasn't bene able ot tell if helps a lotBernita Raisin- from Neuro- sometimes works sometimes doesn't.   Thought had UTI again- got CT again- question if chronic osteomyelitis- also told has several decub?- said is firm- by wound care doesn't see any signs that's it's not healing-  Pt concerned not healing right.    Plan: Patient here for trigger point injections for myofascial pain  Consent done and on chart.  Cleaned areas with alcohol and injected using a 27 gauge 1.5 inch needle  Injected 9.5cc Using 1% Lidocaine with no EPI  Upper traps- B/L x2 Levators B/L  Posterior scalenes Middle scalenes B/L x2 Splenius Capitus B/L  Pectoralis Major- B/L  Rhomboids- B/L x2 Infraspinatus Teres Major/minor Thoracic paraspinals Lumbar paraspinals Other injections-cervical paraspinals B/L     Patient's level of pain prior was 8/10 Current level of pain after injections is 6/10  There was no bleeding or complications.  Patient was advised to drink a lot of water on day after injections to flush system Will have increased soreness for 12-48 hours after injections.  Can use Lidocaine patches the day AFTER injections Can use theracane on day of injections in places didn't inject Can use heating pad 4-6 hours AFTER injections   2. Don't have to take Tizanidine - only take if needs it.   3. Con't Baclofen dantrolene and Valium as  scheduled.   4. Con't Oxycodone- last refill 12/2- doesn't need refill as of yet.   5. Doesn't need refill on Valium- takes prn.    6. No luck with Aimovig- was talking to Neuro about trying Emgality.   7. Discussed pressure ulcer/possible chronic osteomyelitis. I think needs MRI.   8. Dr Parks Ranger- At [facility]- in the wound care center- Plastics surgeon- who loves SCI.  So needs to send referral and let them know I suggest the referral.   9. F/U in 3 months- double visit. SCI   I spent a total of  46  minutes on total care today- >50% coordination of care- due to  10 minutes for injections as well as 36 minutes for f/u as detailed above.

## 2022-02-12 NOTE — Patient Instructions (Signed)
Plan: Patient here for trigger point injections for myofascial pain  Consent done and on chart.  Cleaned areas with alcohol and injected using a 27 gauge 1.5 inch needle  Injected 9.5cc Using 1% Lidocaine with no EPI  Upper traps- B/L x2 Levators B/L  Posterior scalenes Middle scalenes B/L x2 Splenius Capitus B/L  Pectoralis Major- B/L  Rhomboids- B/L x2 Infraspinatus Teres Major/minor Thoracic paraspinals Lumbar paraspinals Other injections-cervical paraspinals B/L     Patient's level of pain prior was 8/10 Current level of pain after injections is 6/10  There was no bleeding or complications.  Patient was advised to drink a lot of water on day after injections to flush system Will have increased soreness for 12-48 hours after injections.  Can use Lidocaine patches the day AFTER injections Can use theracane on day of injections in places didn't inject Can use heating pad 4-6 hours AFTER injections   2. Don't have to take Tizanidine - only take if needs it.   3. Con't Baclofen dantrolene and Valium as scheduled.   4. Con't Oxycodone- last refill 12/2- doesn't need refill as of yet.   5. Doesn't need refill on Valium- takes prn.    6. No luck with Aimovig- was talking to Neuro about trying Emgality.   7. Discussed pressure ulcer/possible chronic osteomyelitis. I think needs MRI.   8. Dr Parks Ranger- At [facility]- in the wound care center- Plastics surgeon- who loves SCI.  So needs to send referral and let them know I suggest the referral.   9. F/U in 3 months- double visit. SCI

## 2022-05-21 ENCOUNTER — Encounter: Payer: Self-pay | Admitting: Physical Medicine and Rehabilitation

## 2022-05-21 ENCOUNTER — Ambulatory Visit: Payer: Medicare PPO | Admitting: Physical Medicine and Rehabilitation

## 2022-05-21 ENCOUNTER — Encounter: Payer: Medicare PPO | Attending: Physical Medicine and Rehabilitation | Admitting: Physical Medicine and Rehabilitation

## 2022-05-21 VITALS — BP 116/74 | HR 78 | Ht 70.0 in | Wt 320.0 lb

## 2022-05-21 DIAGNOSIS — G8221 Paraplegia, complete: Secondary | ICD-10-CM | POA: Diagnosis present

## 2022-05-21 DIAGNOSIS — L89319 Pressure ulcer of right buttock, unspecified stage: Secondary | ICD-10-CM | POA: Diagnosis present

## 2022-05-21 DIAGNOSIS — R252 Cramp and spasm: Secondary | ICD-10-CM | POA: Diagnosis present

## 2022-05-21 DIAGNOSIS — G894 Chronic pain syndrome: Secondary | ICD-10-CM | POA: Insufficient documentation

## 2022-05-21 MED ORDER — BACLOFEN 20 MG PO TABS: 40.0000 mg | ORAL_TABLET | Freq: Three times a day (TID) | ORAL | 3 refills | Status: DC

## 2022-05-21 NOTE — Progress Notes (Signed)
Subjective:    Patient ID: Rachael Turner, female    DOB: 08/29/1971, 51 y.o.   MRN: [id number]  HPI Pt is a 51 yr old female with T7 paraplegia, C Diff (+), frequent UTIs, autonomic dysfunction, Neurogenic bowel and bladder, spasticity and ileoconduit for bladder, multiple Stage IV pressure ulcers and heel pressure ulcers. Here for f/u.  For f/u on paraplegia and spasticity. Still has chronic osteomyelitis. Also has IBS. 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 Shooting for August 2023.   Cannot get Botox before 8/10 and needs to do surgery after that.  [facility] to [facility] her [facility]- gets Botox q8 weeks- wears off at 7 weeks.   Still trying to get nutrition taken care of.  Has found a few protein supplements that if takes 1x/day, doesn't get severe gastric upset.    Treating first UTI since Kidney out- ID-  Had slime coming out of ileoconduit- doing fosfomycin- and urine looked awful- was also given PO Vanc just in case got C Diff.   Also had associated dry heaves.  Shoulder pain- harder to lay on shoulders when in bed lately- more pain.   Experimenting when oxy would be best to take for pain- with muscle pain.  Doesn't help muscle or Migraine pain.   Having more pain in R shoulder/R scapula- and will have shooting pain in back- usually  aching pain, but intermittent shooting pain.  On verge of migraine the last few weeks.  GI pain  better last few weeks. So doesn't think referred pain.  Wondering if due to shooting pain due to osteomyelitis when sitting up straighter.   Tilting in space usually takes care of shooting pain.   Has a regular bed that sleeps on- queen sized bed- quality mattress.   Pain Inventory Average Pain 7 Pain Right Now 6 My pain is intermittent, constant, sharp, tingling, and aching  LOCATION OF PAIN  Neck, Shoulders, bac  BOWEL Number of stools per week: 3 Oral laxative use Yes  Type of laxative Miralax Enema or suppository use Yes    BLADDER Ileal conduit Able to self cath Yes  every 4 hours Leakage with coughing Yes   Incomplete bladder emptying Yes    Mobility use a wheelchair needs help with transfers  Function retired I need assistance with the following:  dressing, bathing, toileting, and household duties Do you have any goals in this area?  no  Neuro/Psych numbness tingling trouble walking spasms dizziness depression  Prior Studies Any changes since last visit?  no  Physicians involved in your care Any changes since last visit?  no   No family history on file. Social History   Socioeconomic History   Marital status: Married    Spouse name: Not on file   Number of children: Not on file   Years of education: Not on file   Highest education level: Not on  file  Occupational History   Not on file  Tobacco Use   Smoking status: Never   Smokeless tobacco: Never  Vaping Use   Vaping Use: Never used  Substance and Sexual Activity   Alcohol use: Not Currently    Comment: rare   Drug use: Never   Sexual activity: Yes    Birth control/protection: Surgical  Other Topics Concern   Not on file  Social History Narrative   Not on file   Social Determinants of Health   Financial Resource Strain: Not on file  Food Insecurity: Not on file  Transportation Needs: Not on file  Physical Activity: Not on file  Stress: Not on file  Social Connections: Not  on file   History reviewed. No pertinent surgical history. Past Medical History:  Diagnosis Date   C. difficile diarrhea 07/2020   Complication of anesthesia    Difficult intubation    BP 116/74   Pulse 78   Ht 5\' 10"  (1.778 m)   Wt (!) 320 lb (145.2 kg)   SpO2 99%   BMI 45.92 kg/m   Opioid Risk Score:   Fall Risk Score:  `1  Depression screen PHQ 2/9     05/21/2022    1:17 PM 02/12/2022    1:47 PM 03/20/2021    1:11 PM 11/07/2020    1:46 PM 08/08/2020   11:47 AM 06/06/2020    2:05 PM 10/08/2019    2:43 PM  Depression screen PHQ 2/9  Decreased Interest 1 0 1 1 3 2 2   Down, Depressed, Hopeless 1 0 1 1 3 1 1   PHQ - 2 Score 2 0 2 2 6 3 3     Review of Systems  Genitourinary:  Positive for menstrual problem.  Musculoskeletal:  Positive for gait problem.       Spasms  Neurological:  Positive for dizziness and numbness.       Tingling  Psychiatric/Behavioral:         Depression  All other systems reviewed and are negative.     Objective:   Physical Exam  Awake, alert, appropriate, in power w/c; joystick on R side, NAD Has ileoconduit in abdomen No movement in LE's B/L 0/5 in all muscles 5/5 in UE's ,but tight in upper shoulders/neck      Assessment & Plan:   Pt is a 51 yr old female with T7 paraplegia, C Diff (+), frequent UTIs, autonomic dysfunction, Neurogenic bowel and bladder, spasticity and ileoconduit for bladder, multiple Stage IV pressure ulcers and heel pressure ulcers. Here for f/u.  For f/u on paraplegia and spasticity. Still has chronic osteomyelitis. Also has IBS. 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   2.  Will refill  Baclofen  3 months supply- with 3 refills.  40 mg TID  3. Doesn't need pain meds refill right now, but happy to refill Oxycodone when needed.    4. Discussed SCI support group.  Will call with details.    5. F/U in 3 months- double visit-    I spent a total of  37  minutes on total care today- >50% coordination of care-  due to prolonged d/w with pt about pressure ulcer.

## 2022-05-21 NOTE — Patient Instructions (Signed)
Pt is a 51 yr old female with T7 paraplegia, C Diff (+), frequent UTIs, autonomic dysfunction, Neurogenic bowel and bladder, spasticity and ileoconduit for bladder, multiple Stage IV pressure ulcers and heel pressure ulcers. Here for f/u.  For f/u on paraplegia and spasticity. Still has chronic osteomyelitis. Also has IBS. 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   2.  Will refill Baclofen  3 months supply- with 3 refills.  40 mg TID  3. Doesn't need pain meds refill right now, but happy to refill Oxycodone when needed.    4. Discussed SCI support group.  Will call with details.    5. F/U in 3 months- double visit-

## 2022-05-23 ENCOUNTER — Encounter: Payer: Self-pay | Admitting: Physical Medicine and Rehabilitation

## 2022-05-26 MED ORDER — DANTROLENE SODIUM 100 MG PO CAPS: 100.0000 mg | ORAL_CAPSULE | Freq: Three times a day (TID) | ORAL | 3 refills | Status: DC

## 2022-07-05 ENCOUNTER — Other Ambulatory Visit: Payer: Self-pay | Admitting: Physical Medicine and Rehabilitation

## 2022-08-13 ENCOUNTER — Telehealth: Payer: Self-pay

## 2022-08-13 NOTE — Telephone Encounter (Signed)
Refill request for Oxycodone. Last filled #120 on 11/27/2021, next appt 09/24/22. Patient states she has #30 left, uses them so infrequently that she didn't realize she was low. Can you address in Dr. Dahlia Client absence/

## 2022-08-16 MED ORDER — OXYCODONE HCL 5 MG PO TABS: 5.0000 mg | ORAL_TABLET | Freq: Four times a day (QID) | ORAL | 0 refills | Status: DC | PRN

## 2022-09-09 ENCOUNTER — Other Ambulatory Visit: Payer: Self-pay | Admitting: Physical Medicine and Rehabilitation

## 2022-09-13 ENCOUNTER — Telehealth: Payer: Self-pay

## 2022-09-13 NOTE — Telephone Encounter (Signed)
PA for Diazepam sent to insurance through [facility]

## 2022-09-14 ENCOUNTER — Encounter: Payer: Self-pay | Admitting: Physical Medicine and Rehabilitation

## 2022-09-14 NOTE — Telephone Encounter (Signed)
PA Case: [id number], Status: Approved, Coverage Starts on: 12/27/2021 12:00:00 AM, Coverage Ends on: 12/26/2022 12:00:00 AM. Questions? Contact [phone].

## 2022-09-21 ENCOUNTER — Encounter: Payer: Self-pay | Admitting: Physical Medicine and Rehabilitation

## 2022-09-24 ENCOUNTER — Encounter: Payer: Medicare PPO | Admitting: Physical Medicine and Rehabilitation

## 2022-09-29 ENCOUNTER — Ambulatory Visit: Payer: Medicare PPO | Admitting: Physical Medicine and Rehabilitation

## 2022-10-13 ENCOUNTER — Encounter: Payer: Medicare PPO | Attending: Physical Medicine and Rehabilitation | Admitting: Physical Medicine and Rehabilitation

## 2022-10-13 ENCOUNTER — Encounter: Payer: Self-pay | Admitting: Physical Medicine and Rehabilitation

## 2022-10-13 VITALS — BP 111/76 | HR 87

## 2022-10-13 DIAGNOSIS — Z5181 Encounter for therapeutic drug level monitoring: Secondary | ICD-10-CM | POA: Insufficient documentation

## 2022-10-13 DIAGNOSIS — Z79891 Long term (current) use of opiate analgesic: Secondary | ICD-10-CM | POA: Insufficient documentation

## 2022-10-13 DIAGNOSIS — G894 Chronic pain syndrome: Secondary | ICD-10-CM | POA: Insufficient documentation

## 2022-10-13 MED ORDER — DANTROLENE SODIUM 100 MG PO CAPS: 100.0000 mg | ORAL_CAPSULE | Freq: Three times a day (TID) | ORAL | 3 refills | Status: DC

## 2022-10-13 MED ORDER — TIZANIDINE HCL 4 MG PO TABS: 4.0000 mg | ORAL_TABLET | Freq: Four times a day (QID) | ORAL | 1 refills | Status: AC | PRN

## 2022-10-13 MED ORDER — DULOXETINE HCL 20 MG PO CPEP: 20.0000 mg | ORAL_CAPSULE | Freq: Every day | ORAL | 3 refills | Status: DC

## 2022-10-13 NOTE — Patient Instructions (Signed)
Pt is a 51 yr old female with T7 paraplegia, C Diff (+), frequent UTIs, autonomic dysfunction, Neurogenic bowel and bladder, spasticity and ileoconduit for bladder, multiple Stage IV pressure ulcers and heel pressure ulcers. Here for f/u.  For f/u on paraplegia and spasticity. Still has chronic osteomyelitis. Also has IBS. 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    Start Duloxetine/Cymbalta 20 mg nightly x 2 week Then 40 mg nightly x 2 week  Then 60 mg nightly- for nerve pain 1% of patients can have nausea with Duloxetine- call me if needs an anti-nausea medicine. Can also cause mild dry mouth/dry eyes and mild constipation.  2. Has Zofran- doesn't need refills right now   3. Trintellix- very expensive- will see if PCP can reduce/wean off so can start Cymbalta.  - suggest wean to 10 mg daily x 2 weeks, then 5 mg daily x 2 weeks  4. Once reduces Trintellix to 10 mg daily- can restart Cymbalta 20 mg daily or nightly x 2 week, then as reduces Trentellix down to 5 mg, then increase Cymbalta to 40 mg daily/nightly- and once off Trentellix, then can increase Duloxetine to 60 mg daily/nightly.  5. Once off Trintellix- go as slow as you need to- , suggest weaning Remeron- can reduce  by cutting in half to 15 mg nightly for 2-4 weeks-  (Causes weight gain)   6. Con't Oxyocdone- has enough-doesn't need refill right now.    7. Oral drug screen- done today   8. Con't Baclofen 40 mg - had year's supply- so is OK. Con't for spasticity  9. Con't Dantrolene- needs refills- 100 mg TID- #270- 3 months supply with 1 year supply. Con't for spasticity  10. Diazepam- got last month- isn't due yet. Con't for spasticity  11. If Duloxetine doesn't work, then can try Gabapentin gel- for itching.    12. Don't have an easy way to increase spasticity meds-   13. Zanaflex 4 mg q 6 hours as needed for spasticity- #180- with refills.   14. F/U in 49months- double appointment  15.  SCI support group- last Thursday of each month- 6-7 pm at [employer] [facility]

## 2022-10-13 NOTE — Progress Notes (Signed)
Subjective:    Patient ID: Rachael Turner, female    DOB: [birth year], 51 y.o.   MRN: [id number]  HPI Pt is a 51 yr old female with T7 paraplegia, C Diff (+), frequent UTIs, autonomic dysfunction, Neurogenic bowel and bladder, spasticity and ileoconduit for bladder, multiple Stage IV pressure ulcers and heel pressure ulcers. Here for f/u.  For f/u on paraplegia and spasticity. Still has chronic osteomyelitis. Also has IBS. Poor nutrition S/p L kidney removal 5/22  Here for f/u on paraplegia.    Itching everywhere- above level of lesion.  Itches all the time-  Worse when spasticity worse Most intense- in rhomboids around L scapula and spinal cord.  24/7- comes and goes in terms of intensity When stomach irritated, it gets worse as well.  Esp Methenamine and Vit C-  Cut out caffeine- no improvement cutting out caffeine. Doesn't think it's heat Thinks muscles are sore-    Spasticity still elevated after flap-  More than before. Legs still jerking and tension/circumference of T7  Tried: Lyrica- caused n/V Cymbalta- on Trintellix- on 20 mg daily- max dose- has taken cymbalta in past for IBS- helped- tolerated, but bad wean off.   Trintellix didn't work better than any other.  Still on Remeron 30 mg - on for years-   Has gained weight- clothes fitting tighter.  Doesn't know why.    Heels are completely healed! Came home from SNF with wound on back of leg-  Big scab on it- looks like healed pretty well underneath.  Thinks came from SCDs.        Pain Inventory Average Pain 6 Pain Right Now 4 My pain is intermittent, sharp, dull, stabbing, and tingling  LOCATION OF PAIN  back, under breast  BOWEL Number of stools per week: 3 Oral laxative use Yes  Type of laxative mirralax Enema or suppository use Yes  History of colostomy No  Incontinent Yes   BLADDER Ileal conduit In and out cath, frequency 4 hrs Able to self cath Yes  Bladder incontinence No  Frequent  urination No  Leakage with coughing No  Difficulty starting stream No  Incomplete bladder emptying No    Mobility use a wheelchair  Function disabled: date disabled 09/2007 retired I need assistance with the following:  dressing, toileting, and household duties  Neuro/Psych bowel control problems tingling spasms depression anxiety  Prior Studies Any changes since last visit?  no  Physicians involved in your care Any changes since last visit?  no   No family history on file. Social History   Socioeconomic History   Marital status: Married    Spouse name: Not on file   Number of children: Not on file   Years of education: Not on file   Highest education level: Not on file  Occupational History   Not on file  Tobacco Use   Smoking status: Never   Smokeless tobacco: Never  Vaping Use   Vaping Use: Never used  Substance and Sexual Activity   Alcohol use: Not Currently    Comment: rare   Drug use: Never   Sexual activity: Yes    Birth control/protection: Surgical  Other Topics Concern   Not on file  Social History Narrative   Not on file   Social Determinants of Health   Financial Resource Strain: Not on file  Food Insecurity: Not on file  Transportation Needs: Not on file  Physical Activity: Not on file  Stress: Not on file  Social Connections: Not  on file   No past surgical history on file. Past Medical History:  Diagnosis Date   C. difficile diarrhea 97/0263   Complication of anesthesia    Difficult intubation    BP 111/76   Pulse 87   SpO2 96%   Opioid Risk Score:   Fall Risk Score:  `1  Depression screen PHQ 2/9     05/21/2022    1:17 PM 02/12/2022    1:47 PM 03/20/2021    1:11 PM 11/07/2020    1:46 PM 08/08/2020   11:47 AM 06/06/2020    2:05 PM 10/08/2019    2:43 PM  Depression screen PHQ 2/9  Decreased Interest 1 0 1 1 3 2 2   Down, Depressed, Hopeless 1 0 1 1 3 1 1   PHQ - 2 Score 2 0 2 2 6 3 3      Review of Systems   Gastrointestinal:  Positive for abdominal pain, constipation, diarrhea and nausea.  Psychiatric/Behavioral:  Positive for dysphoric mood. The patient is nervous/anxious.   All other systems reviewed and are negative.     Objective:   Physical Exam Awake, alert, appropriate in power w/c; appears to have gained some weight, NAD TTP/sore over L>R rhomboids and thoracic paraspinals Doing pressure relief every 10 minutes or so Showed me pic of wound- looks great- tiny area open, but rest almost scarred.       Assessment & Plan:   Pt is a 51 yr old female with T7 paraplegia, C Diff (+), frequent UTIs, autonomic dysfunction, Neurogenic bowel and bladder, spasticity and ileoconduit for bladder, multiple Stage IV pressure ulcers and heel pressure ulcers. Here for f/u.  For f/u on paraplegia and spasticity. Still has chronic osteomyelitis. Also has IBS. 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    Start Duloxetine/Cymbalta 20 mg nightly x 2 week Then 40 mg nightly x 2 week  Then 60 mg nightly- for nerve pain 1% of patients can have nausea with Duloxetine- call me if needs an anti-nausea medicine. Can also cause mild dry mouth/dry eyes and mild constipation.  2. Has Zofran- doesn't need refills right now   3. Trintellix- very expensive- will see if PCP can reduce/wean off so can start Cymbalta.  - suggest wean to 10 mg daily x 2 weeks, then 5 mg daily x 2 weeks  4. Once reduces Trintellix to 10 mg daily- can restart Cymbalta 20 mg daily or nightly x 2 week, then as reduces Trentellix down to 5 mg, then increase Cymbalta to 40 mg daily/nightly- and once off Trentellix, then can increase Duloxetine to 60 mg daily/nightly.  5. Once off Trintellix- go as slow as you need to- , suggest weaning Remeron- can reduce  by cutting in half to 15 mg nightly for 2-4 weeks-  (Causes weight gain)   6. Con't Oxyocdone- has enough-doesn't need refill right now.    7. Oral drug  screen- done today   8. Con't Baclofen 40 mg - had year's supply- so is OK. Con't for spasticity  9. Con't Dantrolene- needs refills- 100 mg TID- #270- 3 months supply with 1 year supply. Con't for spasticity  10. Diazepam- got last month- isn't due yet. Con't for spasticity  11. If Duloxetine doesn't work, then can try Gabapentin gel- for itching.    12. Don't have an easy way to increase spasticity meds-   13. Zanaflex 4 mg q 6 hours as needed for spasticity- #180- with refills.   14.  F/U in 37months- double appointment  15. SCI support group- last Thursday of each month- 6-7 pm at [facility]   I spent a total of  43  minutes on total care today- >50% coordination of care- due to discussion of options for neuropathic itching

## 2022-10-16 LAB — DRUG TOX MONITOR 1 W/CONF, ORAL FLD
Alprazolam: NEGATIVE ng/mL (ref <0.50)
Amphetamines: NEGATIVE ng/mL (ref <10)
Barbiturates: NEGATIVE ng/mL (ref <10)
Benzodiazepines: POSITIVE ng/mL — AB (ref <0.50)
Buprenorphine: NEGATIVE ng/mL (ref <0.10)
Chlordiazepoxide: NEGATIVE ng/mL (ref <0.50)
Clonazepam: NEGATIVE ng/mL (ref <0.50)
Cocaine: NEGATIVE ng/mL (ref <5.0)
Diazepam: 1.11 ng/mL — ABNORMAL HIGH (ref <0.50)
Fentanyl: NEGATIVE ng/mL (ref <0.10)
Flunitrazepam: NEGATIVE ng/mL (ref <0.50)
Flurazepam: NEGATIVE ng/mL (ref <0.50)
Heroin Metabolite: NEGATIVE ng/mL (ref <1.0)
Lorazepam: NEGATIVE ng/mL (ref <0.50)
MARIJUANA: NEGATIVE ng/mL (ref <2.5)
MDMA: NEGATIVE ng/mL (ref <10)
Meprobamate: NEGATIVE ng/mL (ref <2.5)
Methadone: NEGATIVE ng/mL (ref <5.0)
Midazolam: NEGATIVE ng/mL (ref <0.50)
Nicotine Metabolite: NEGATIVE ng/mL (ref <5.0)
Nordiazepam: 1.4 ng/mL — ABNORMAL HIGH (ref <0.50)
Opiates: NEGATIVE ng/mL (ref <2.5)
Oxazepam: NEGATIVE ng/mL (ref <0.50)
Phencyclidine: NEGATIVE ng/mL (ref <10)
Tapentadol: NEGATIVE ng/mL (ref <5.0)
Temazepam: NEGATIVE ng/mL (ref <0.50)
Tramadol: NEGATIVE ng/mL (ref <5.0)
Triazolam: NEGATIVE ng/mL (ref <0.50)
Zolpidem: NEGATIVE ng/mL (ref <5.0)

## 2022-10-16 LAB — DRUG TOX ALC METAB W/CON, ORAL FLD: Alcohol Metabolite: NEGATIVE ng/mL (ref <25)

## 2022-10-18 ENCOUNTER — Encounter: Payer: Self-pay | Admitting: Physical Medicine and Rehabilitation

## 2022-10-18 ENCOUNTER — Telehealth: Payer: Self-pay | Admitting: *Deleted

## 2022-10-18 DIAGNOSIS — L89304 Pressure ulcer of unspecified buttock, stage 4: Secondary | ICD-10-CM

## 2022-10-18 DIAGNOSIS — G8220 Paraplegia, unspecified: Secondary | ICD-10-CM

## 2022-10-18 NOTE — Telephone Encounter (Signed)
Oral swab drug screen was consistent for prescribed medications diazepam. There was no oxycodone present and reported last dose was taken 2 days before.  This is the second drug screen without oxycodone present. I know she takes it sporadically but we have to demonstrate she is taking the medication we prescribe. Suggest repeat next visit and if she has not taken in a few days we must witness her taking it and then perform the test.

## 2022-11-10 ENCOUNTER — Other Ambulatory Visit: Payer: Self-pay | Admitting: Physical Medicine and Rehabilitation

## 2022-11-10 ENCOUNTER — Encounter: Payer: Self-pay | Admitting: Physical Medicine and Rehabilitation

## 2022-11-10 MED ORDER — PROMETHAZINE HCL 25 MG PO TABS: 12.5000 mg | ORAL_TABLET | Freq: Four times a day (QID) | ORAL | 1 refills | Status: DC | PRN

## 2022-11-11 ENCOUNTER — Encounter: Payer: Self-pay | Admitting: Physical Medicine and Rehabilitation

## 2023-01-03 ENCOUNTER — Ambulatory Visit: Payer: Medicare PPO | Admitting: Physical Medicine and Rehabilitation

## 2023-01-17 ENCOUNTER — Encounter: Payer: Self-pay | Admitting: Physical Medicine and Rehabilitation

## 2023-01-17 ENCOUNTER — Encounter: Payer: Medicare PPO | Attending: Physical Medicine and Rehabilitation | Admitting: Physical Medicine and Rehabilitation

## 2023-01-17 VITALS — BP 128/75 | HR 77 | Ht 70.0 in

## 2023-01-17 DIAGNOSIS — R252 Cramp and spasm: Secondary | ICD-10-CM | POA: Diagnosis present

## 2023-01-17 DIAGNOSIS — G894 Chronic pain syndrome: Secondary | ICD-10-CM

## 2023-01-17 DIAGNOSIS — M7918 Myalgia, other site: Secondary | ICD-10-CM

## 2023-01-17 DIAGNOSIS — Z993 Dependence on wheelchair: Secondary | ICD-10-CM

## 2023-01-17 DIAGNOSIS — G8221 Paraplegia, complete: Secondary | ICD-10-CM | POA: Diagnosis present

## 2023-01-17 DIAGNOSIS — M542 Cervicalgia: Secondary | ICD-10-CM

## 2023-01-17 MED ORDER — OXYCODONE HCL 5 MG PO TABS: 5.0000 mg | ORAL_TABLET | Freq: Four times a day (QID) | ORAL | 0 refills | Status: DC | PRN

## 2023-01-17 MED ORDER — PROMETHAZINE HCL 25 MG PO TABS: 12.5000 mg | ORAL_TABLET | Freq: Four times a day (QID) | ORAL | 1 refills | Status: DC | PRN

## 2023-01-17 NOTE — Patient Instructions (Signed)
Pt is a 52 yr old female with T7 paraplegia, C Diff (+), frequent UTIs, autonomic dysfunction, Neurogenic bowel and bladder, spasticity and ileoconduit for bladder, multiple Stage IV pressure ulcers and heel pressure ulcers. Here for f/u.  For f/u on paraplegia and spasticity. Still has chronic osteomyelitis. Also has IBS. 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   Will renew pt's Phenergan 25 mg- 1/2 to 1 tab q6 hours prn # 120- with refills.  2. Oxycodone 5 mg q6 hours prn # 120- doesn't take frequently- last Rx 8/23 and has 50 pills/day.   3. Pepcid can sometimes help not just reflux- it's an H2 blocker- that might help itching.   4. Will refer to Dr Glennon Mac at [place] for OMT- but might have to wait until she can transfer herself after working with Cristie Hem at [employer].   5. Spasticity about the same/slightly worse- Con't baclofen and dantrolene- hasn't figured out why.   6. F/U in 3 months- double appt- SCI

## 2023-01-17 NOTE — Progress Notes (Signed)
Subjective:    Patient ID: Rachael Turner, female    DOB: 11-21-1971, 52 y.o.   MRN: [id number]  HPI  Pt is a 52 yr old female with T7 paraplegia, C Diff (+), frequent UTIs, autonomic dysfunction, Neurogenic bowel and bladder, spasticity and ileoconduit for bladder, multiple Stage IV pressure ulcers and heel pressure ulcers. Here for f/u.  For f/u on paraplegia and spasticity. Still has chronic osteomyelitis. Also has IBS. 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   Last Oxy was filled in August 2023- still has ~ 53 of 120 pills left  Things going "pretty good".    Cristie Hem was able to get insurance to send extension for therapy.   Finally scheduled- sees him tomorrow.  Having a lot of dizziness or nausea lately.  Food sensitivity or UTI, etc.  Scared will get in way of PT.  Sleepy all the time- feels like could sleep 20 hours/day-  Like this for several weeks.   Getting a lot of signs with migraines and "tuff like that".  Cannot figure out what it is yet.   Released by Dr Quentin Cornwall- good healthy scar tissue. Keep moisturized and or bandaged for next year.  Has been trying to wear bandage since easier than moisturizing.   Still has wounds on heels - came back and back to wearing PRAFO's. Until PRAFOs until everything completely clears up.  Made modifications to shoes to see if will not cause  wounds.   New wound in groin- not pressure- sending pics to wound care people.  They don't have a hoyer lift at [facility] so harder to transfer.  CNA said would bring pickup truck and take hoyer lift- to appointment.     Itching coming back again. TENS helps sometimes- capsacin helps sometimes. Meds don't help so far.      Pain Inventory Average Pain 5 Pain Right Now 4 My pain is sharp, dull, stabbing, tingling, and aching  In the last 24 hours, has pain interfered with the following? General activity 0 Relation with others 0 Enjoyment of life  0 What TIME of day is your pain at its worst? varies Sleep (in general) Fair  Pain is worse with: unsure Pain improves with:  . Relief from Meds:  .  use a wheelchair Do you have any goals in this area?  no  disabled: date disabled oct 2008 I need assistance with the following:  bathing and toileting Do you have any goals in this area?  yes  bowel control problems weakness numbness tingling spasms dizziness depression  Any changes since last visit?  no  Any changes since last visit?  no    History reviewed. No pertinent family history. Social History   Socioeconomic History   Marital status: Married    Spouse name: Not on file   Number of children: Not on file   Years of education: Not on file   Highest education level: Not on file  Occupational History   Not on file  Tobacco Use   Smoking status: Never   Smokeless tobacco: Never  Vaping Use   Vaping Use: Never used  Substance and Sexual Activity   Alcohol use: Not Currently    Comment: rare   Drug use: Never   Sexual activity: Yes    Birth control/protection: Surgical  Other Topics Concern   Not on file  Social History Narrative   Not on file   Social Determinants of Health  Financial Resource Strain: Not on file  Food Insecurity: Not on file  Transportation Needs: Not on file  Physical Activity: Not on file  Stress: Not on file  Social Connections: Not on file   History reviewed. No pertinent surgical history. Past Medical History:  Diagnosis Date   C. difficile diarrhea 27/2536   Complication of anesthesia    Difficult intubation    Ht 5\' 10"  (1.778 m)   BMI 45.92 kg/m   Opioid Risk Score:   Fall Risk Score:  `1  Depression screen PHQ 2/9     01/17/2023    2:44 PM 05/21/2022    1:17 PM 02/12/2022    1:47 PM 03/20/2021    1:11 PM 11/07/2020    1:46 PM 08/08/2020   11:47 AM 06/06/2020    2:05 PM  Depression screen PHQ 2/9  Decreased Interest 0 1 0 1 1 3 2   Down, Depressed,  Hopeless 0 1 0 1 1 3 1   PHQ - 2 Score 0 2 0 2 2 6 3       Review of Systems  Musculoskeletal:  Positive for back pain, gait problem and neck pain.  All other systems reviewed and are negative.     Objective:   Physical Exam Awake, alert, appropriate, sititng up in power w/c; NAD Joystick on R side In PRAFO's B/L MAS of 1-2 in LE's- a few spasms in LE's B/L MS; )/5 in LE's.        Assessment & Plan:   Pt is a 52 yr old female with T7 paraplegia, C Diff (+), frequent UTIs, autonomic dysfunction, Neurogenic bowel and bladder, spasticity and ileoconduit for bladder, multiple Stage IV pressure ulcers and heel pressure ulcers. Here for f/u.  For f/u on paraplegia and spasticity. Still has chronic osteomyelitis. Also has IBS. 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   Will renew pt's Phenergan 25 mg- 1/2 to 1 tab q6 hours prn # 120- with refills.  2. Oxycodone 5 mg q6 hours prn # 120- doesn't take frequently- last Rx 8/23 and has 50 pills/day.   3. Pepcid can sometimes help not just reflux- it's an H2 blocker- that might help itching.   4. Will refer to Dr Glennon Mac at [place] for OMT- but might have to wait until she can transfer herself after working with Cristie Hem at [employer].   5. Spasticity about the same/slightly worse- Con't baclofen and dantrolene- hasn't figured out why.   6. F/U in 3 months- double appt- SCI  I spent a total of  34  minutes on total care today- >50% coordination of care- due to  discussing wounds and allergies, and migraines. As wlel as pain and OMT.

## 2023-03-10 NOTE — Progress Notes (Signed)
Benito Mccreedy D.[place] Phone: [phone]   Assessment and Plan:     1. Neck pain 2. Intractable migraine with aura without status migrainosus 3. Paraplegia, complete ([place]) 4. Somatic dysfunction of cervical region 5. Somatic dysfunction of thoracic region 6. Somatic dysfunction of rib region  -Chronic with exacerbation, initial sports medicine visit - History of T7 SCI with resulting paraplegia, and worsening neck, bilateral trapezius, shoulder pain related to physical activity and transfers - Recommend continuing treatment with Dr. Dagoberto Ligas and physical therapy - Start HEP for neck, trapezius, shoulders - Patient elected to trial OMT at today's visit.  Tolerated well per note below.  Patient is in the process of learning a new transfer method, however is not proficient at it at this time, so OMT was performed with patient and her electric wheelchair in an upright position  After verbal consent patient was treated with  ME (muscle energy), FPR (flex positional release), ST (soft tissue), techniques in cervical, rib, thoracic,  areas. Patient tolerated the procedure well with improvement in symptoms.  Patient educated on potential side effects of soreness and recommended to rest, hydrate, and use Tylenol as needed for pain control.   Pertinent previous records reviewed include physical therapy note 03/08/2023, physical therapy note 03/03/2023, lumbar MRI 05/23/2020, PMNR note 01/17/2023   Follow Up: 3 to 4 weeks for reevaluation.  Could consider repeat OMT if patient found today's treatment beneficial   Subjective:   I, Moenique Parris, am serving as a Education administrator for Doctor Glennon Mac  Chief Complaint: neck pain   HPI:   03/17/2023 Patient is a 52 year old female complaining of neck pain. Patient states that she has neck pain off and on for years, pain at the base of the skull and down to her neck and  shoulders,  pain is affecting her sinus , migraines, nausea. she is worried there is an alignment issue in her neck , decreased ROM ,constant pain , she does get botox for migraines , she is having some spasticity as well , trigger point didn't help    Relevant Historical Information: T7 SCI with paraplegia, autonomic dysfunction, wheelchair dependent, on anticoagulation chronically, status post left nephrectomy  Additional pertinent review of systems negative.   Current Outpatient Medications:    adapalene (DIFFERIN) 0.1 % gel, Apply topically daily., Disp: , Rfl:    adapalene (DIFFERIN) 0.1 % gel, Apply topically., Disp: , Rfl:    Amino Acids-Protein Hydrolys (PRO-STAT 101 PO), Take by mouth., Disp: , Rfl:    Aspirin-Caffeine 1000-65 MG PACK, Take by mouth., Disp: , Rfl:    baclofen (LIORESAL) 20 MG tablet, TAKE 2 TABLETS BY MOUTH THREE TIMES DAILY, Disp: 540 tablet, Rfl: 3   bisacodyl (DULCOLAX) 10 MG suppository, Place rectally., Disp: , Rfl:    bisacodyl (DULCOLAX) 10 MG suppository, Place rectally., Disp: , Rfl:    bisacodyl (DULCOLAX) 10 MG suppository, Place rectally., Disp: , Rfl:    bismuth subsalicylate (PEPTO BISMOL) 262 MG/15ML suspension, Take by mouth., Disp: , Rfl:    botulinum toxin Type A (BOTOX) 100 units SOLR injection, Inject into the skin., Disp: , Rfl:    Botulinum Toxin Type A, Cosm, 100 units SOLR, Inject into the skin., Disp: , Rfl:    calcium carbonate (TUMS EX) 750 MG chewable tablet, Chew by mouth., Disp: , Rfl:    ciprofloxacin (CIPRO) 500 MG tablet, Take 500 mg by mouth 2 (two) times daily.,  Disp: , Rfl:    clindamycin (CLEOCIN T) 1 % lotion, Apply topically 2 (two) times daily., Disp: , Rfl:    clindamycin (CLEOCIN T) 1 % lotion, Apply topically., Disp: , Rfl:    cyclobenzaprine (FLEXERIL) 10 MG tablet, , Disp: , Rfl:    dantrolene (DANTRIUM) 100 MG capsule, Take 1 capsule (100 mg total) by mouth 3 (three) times daily., Disp: 270 capsule, Rfl: 3   diazepam  (VALIUM) 5 MG tablet, TAKE 1 TABLET BY MOUTH EVERY 12 HOURS AS NEEDED FOR MUSCLE SPASMS, Disp: 60 tablet, Rfl: 5   diphenhydrAMINE (SOMINEX) 25 MG tablet, Take by mouth., Disp: , Rfl:    Docusate Sodium (DSS) 100 MG CAPS, Take by mouth., Disp: , Rfl:    DULoxetine (CYMBALTA) 20 MG capsule, Take 1 capsule (20 mg total) by mouth at bedtime. X 1 week then 40 mg nightly x 1 week, then 60 mg nightly- for neuropathic nerve pain- to replace trentellix, Disp: 90 capsule, Rfl: 3   esomeprazole (NEXIUM) 40 MG capsule, TAKE ONE CAPSULE BY MOUTH TWICE DAILY, Disp: , Rfl:    fosfomycin (MONUROL) 3 g PACK, SMARTSIG:1 Packet(s) By Mouth Every Other Day, Disp: , Rfl:    Galcanezumab-gnlm 120 MG/ML SOAJ, Take 2 doses the first month (use 2 pen injectors) and then inject one dose monthly, Disp: , Rfl:    guaiFENesin (MUCINEX) 600 MG 12 hr tablet, Take by mouth., Disp: , Rfl:    heparin 5000 UNIT/ML injection, , Disp: , Rfl:    Lactobacillus-Inulin (CULTURELLE ULTIMATE STRENGTH) CAPS, Take 1 capsule by mouth daily., Disp: , Rfl:    loperamide (IMODIUM A-D) 2 MG tablet, Take by mouth., Disp: , Rfl:    loperamide (IMODIUM A-D) 2 MG tablet, Take by mouth., Disp: , Rfl:    Magnesium Bisglycinate (MAG GLYCINATE) 100 MG TABS, Take 1 tablet by mouth at bedtime., Disp: , Rfl:    Magnesium Bisglycinate (MAG GLYCINATE) 100 MG TABS, Take 2 tablets by mouth at bedtime., Disp: , Rfl:    methenamine (HIPREX) 1 g tablet, Take by mouth., Disp: , Rfl:    miconazole nitrate (MONISTAT) 200 & 2 MG-% (9GM) KIT, Place vaginally., Disp: , Rfl:    mirtazapine (REMERON) 30 MG tablet, Take 30 mg by mouth at bedtime., Disp: , Rfl:    NEXIUM 40 MG capsule, Take 40 mg by mouth daily. , Disp: , Rfl:    nitrofurantoin, macrocrystal-monohydrate, (MACROBID) 100 MG capsule, Take 100 mg by mouth 2 (two) times daily., Disp: , Rfl:    nystatin (MYCOSTATIN/NYSTOP) powder, Apply to skinfolds prone to moisture (groin, under the breasts, abdomen), Disp: ,  Rfl:    omeprazole (PRILOSEC) 20 MG capsule, Take by mouth., Disp: , Rfl:    ondansetron (ZOFRAN) 8 MG tablet, TAKE 1 TABLET BY MOUTH EVERY 8 HOURS AS NEEDED, Disp: 90 tablet, Rfl: 5   ondansetron (ZOFRAN-ODT) 8 MG disintegrating tablet, Take by mouth., Disp: , Rfl:    ondansetron (ZOFRAN-ODT) 8 MG disintegrating tablet, Take by mouth., Disp: , Rfl:    oxyCODONE (ROXICODONE) 5 MG immediate release tablet, Take 1 tablet (5 mg total) by mouth every 6 (six) hours as needed. To replace percocet- since having difficulties with tylenol, Disp: 120 tablet, Rfl: 0   phytonadione (VITAMIN K) 5 MG tablet, Take by mouth., Disp: , Rfl:    polyethylene glycol powder (GLYCOLAX/MIRALAX) 17 GM/SCOOP powder, Take 3 teaspoonsfuls daily for constipation, Disp: , Rfl:    potassium chloride SA (K-DUR) 20 MEQ tablet, Take  by mouth. , Disp: , Rfl:    Probiotic Product (CULTURELLE IMMUNE DEFENSE) CAPS, Take by mouth., Disp: , Rfl:    Probiotic Product (PROBIOTIC BLEND PO), Take 1 capsule by mouth daily., Disp: , Rfl:    promethazine (PHENERGAN) 12.5 MG suppository, Place rectally., Disp: , Rfl:    promethazine (PHENERGAN) 25 MG tablet, Take 0.5-1 tablets (12.5-25 mg total) by mouth every 6 (six) hours as needed for nausea or vomiting (take instead of zofran regularly)., Disp: 120 tablet, Rfl: 1   pseudoephedrine (SUDAFED) 120 MG 12 hr tablet, Take by mouth., Disp: , Rfl:    sodium chloride irrigation 0.9 % irrigation, Irrigate with as directed., Disp: , Rfl:    Sodium Chloride, GU Irrigant, (0.9 % SODIUM CHLORIDE, POUR BTL, OPTIME), Irrigate with 1,000 mLs as directed daily., Disp: 10000 mL, Rfl: 11   SUMAtriptan 6 MG/0.5ML SOAJ, Inject into the skin., Disp: , Rfl:    tiZANidine (ZANAFLEX) 4 MG tablet, Take 1 tablet (4 mg total) by mouth every 6 (six) hours as needed for muscle spasms., Disp: 180 tablet, Rfl: 1   TRINTELLIX 5 MG TABS tablet, Take 15 mg by mouth daily., Disp: , Rfl:    UBRELVY 50 MG TABS, Take by mouth.,  Disp: , Rfl:    Ubrogepant 100 MG TABS, Take one at onset of severe headache; may repeat once in 2 hours if headache persists; max of 2 in 24 hours, Disp: , Rfl:    vancomycin (VANCOCIN) 1 g injection, SMARTSIG:IV, Disp: , Rfl:    vancomycin (VANCOCIN) 125 MG capsule, Take by mouth., Disp: , Rfl:    vortioxetine HBr (TRINTELLIX) 20 MG TABS tablet, Take 20 mg by mouth 1 day or 1 dose., Disp: , Rfl:    warfarin (COUMADIN) 1 MG tablet, Take by mouth., Disp: , Rfl:    warfarin (COUMADIN) 2 MG tablet, Take by mouth., Disp: , Rfl:    warfarin (COUMADIN) 4 MG tablet, Take by mouth., Disp: , Rfl:    warfarin (COUMADIN) 6 MG tablet, Take by mouth., Disp: , Rfl:    Objective:     Vitals:   03/17/23 1104  Pulse: 73  SpO2: 98%  Weight: (!) 320 lb (145.2 kg)  Height: 5\' 10"  (1.778 m)      Body mass index is 45.92 kg/m.    Physical Exam:    General: Well-appearing, cooperative, sitting comfortably in electric wheelchair.  No acute distress.   OMT Physical Exam:   Cervical: TTP paraspinal, decreased left sidebending due to tight right paraspinal musculature.  No significant cervical vertebral dysfunction Rib: Bilateral elevated first rib with TTP Thoracic: TTP paraspinal, trapezius.  [place] hump with increased kyphotic curvature at cervicothoracic junction.  T3-6 RRSL     Electronically signed by:  Benito Mccreedy D.[facility] 12:10 PM 03/17/23

## 2023-03-17 ENCOUNTER — Ambulatory Visit: Payer: Medicare PPO | Admitting: Sports Medicine

## 2023-03-17 VITALS — HR 73 | Ht 70.0 in | Wt 320.0 lb

## 2023-03-17 DIAGNOSIS — M9908 Segmental and somatic dysfunction of rib cage: Secondary | ICD-10-CM | POA: Diagnosis not present

## 2023-03-17 DIAGNOSIS — M9902 Segmental and somatic dysfunction of thoracic region: Secondary | ICD-10-CM

## 2023-03-17 DIAGNOSIS — G8221 Paraplegia, complete: Secondary | ICD-10-CM | POA: Diagnosis not present

## 2023-03-17 DIAGNOSIS — M9901 Segmental and somatic dysfunction of cervical region: Secondary | ICD-10-CM | POA: Diagnosis not present

## 2023-03-17 DIAGNOSIS — G43119 Migraine with aura, intractable, without status migrainosus: Secondary | ICD-10-CM | POA: Diagnosis not present

## 2023-03-17 DIAGNOSIS — M542 Cervicalgia: Secondary | ICD-10-CM | POA: Diagnosis not present

## 2023-03-17 NOTE — Patient Instructions (Addendum)
Good to see you Neck trap and shoulder HEP 3-4 week follow up

## 2023-04-11 NOTE — Progress Notes (Unsigned)
Aleen Sells [name] [address] [place] [address] Phone: [phone]   Assessment and Plan:     There are no diagnoses linked to this encounter.  ***   Pertinent previous records reviewed include ***   Follow Up: ***     Subjective:   I, Rachael Turner, am serving as a Neurosurgeon for Doctor Richardean Sale   Chief Complaint: neck pain    HPI:    03/17/2023 Patient is a 52 year old female complaining of neck pain. Patient states that she has neck pain off and on for years, pain at the base of the skull and down to her neck and shoulders,  pain is affecting her sinus , migraines, nausea. she is worried there is an alignment issue in her neck , decreased ROM ,constant pain , she does get botox for migraines , she is having some spasticity as well , trigger point didn't help    04/12/2023 Patient states    Relevant Historical Information: T7 SCI with paraplegia, autonomic dysfunction, wheelchair dependent, on anticoagulation chronically, status post left nephrectomy  Additional pertinent review of systems negative.   Current Outpatient Medications:    adapalene (DIFFERIN) 0.1 % gel, Apply topically daily., Disp: , Rfl:    adapalene (DIFFERIN) 0.1 % gel, Apply topically., Disp: , Rfl:    Amino Acids-Protein Hydrolys (PRO-STAT 101 PO), Take by mouth., Disp: , Rfl:    Aspirin-Caffeine 1000-65 MG PACK, Take by mouth., Disp: , Rfl:    baclofen (LIORESAL) 20 MG tablet, TAKE 2 TABLETS BY MOUTH THREE TIMES DAILY, Disp: 540 tablet, Rfl: 3   bisacodyl (DULCOLAX) 10 MG suppository, Place rectally., Disp: , Rfl:    bisacodyl (DULCOLAX) 10 MG suppository, Place rectally., Disp: , Rfl:    bisacodyl (DULCOLAX) 10 MG suppository, Place rectally., Disp: , Rfl:    bismuth subsalicylate (PEPTO BISMOL) 262 MG/15ML suspension, Take by mouth., Disp: , Rfl:    botulinum toxin Type A (BOTOX) 100 units SOLR injection, Inject into the skin., Disp: ,  Rfl:    Botulinum Toxin Type A, Cosm, 100 units SOLR, Inject into the skin., Disp: , Rfl:    calcium carbonate (TUMS EX) 750 MG chewable tablet, Chew by mouth., Disp: , Rfl:    ciprofloxacin (CIPRO) 500 MG tablet, Take 500 mg by mouth 2 (two) times daily., Disp: , Rfl:    clindamycin (CLEOCIN T) 1 % lotion, Apply topically 2 (two) times daily., Disp: , Rfl:    clindamycin (CLEOCIN T) 1 % lotion, Apply topically., Disp: , Rfl:    cyclobenzaprine (FLEXERIL) 10 MG tablet, , Disp: , Rfl:    dantrolene (DANTRIUM) 100 MG capsule, Take 1 capsule (100 mg total) by mouth 3 (three) times daily., Disp: 270 capsule, Rfl: 3   diazepam (VALIUM) 5 MG tablet, TAKE 1 TABLET BY MOUTH EVERY 12 HOURS AS NEEDED FOR MUSCLE SPASMS, Disp: 60 tablet, Rfl: 5   diphenhydrAMINE (SOMINEX) 25 MG tablet, Take by mouth., Disp: , Rfl:    Docusate Sodium (DSS) 100 MG CAPS, Take by mouth., Disp: , Rfl:    DULoxetine (CYMBALTA) 20 MG capsule, Take 1 capsule (20 mg total) by mouth at bedtime. X 1 week then 40 mg nightly x 1 week, then 60 mg nightly- for neuropathic nerve pain- to replace trentellix, Disp: 90 capsule, Rfl: 3   esomeprazole (NEXIUM) 40 MG capsule, TAKE ONE CAPSULE BY MOUTH TWICE DAILY, Disp: , Rfl:    fosfomycin (MONUROL) 3 g PACK,  SMARTSIG:1 Packet(s) By Mouth Every Other Day, Disp: , Rfl:    Galcanezumab-gnlm 120 MG/ML SOAJ, Take 2 doses the first month (use 2 pen injectors) and then inject one dose monthly, Disp: , Rfl:    guaiFENesin (MUCINEX) 600 MG 12 hr tablet, Take by mouth., Disp: , Rfl:    heparin 5000 UNIT/ML injection, , Disp: , Rfl:    Lactobacillus-Inulin (CULTURELLE ULTIMATE STRENGTH) CAPS, Take 1 capsule by mouth daily., Disp: , Rfl:    loperamide (IMODIUM A-D) 2 MG tablet, Take by mouth., Disp: , Rfl:    loperamide (IMODIUM A-D) 2 MG tablet, Take by mouth., Disp: , Rfl:    Magnesium Bisglycinate (MAG GLYCINATE) 100 MG TABS, Take 1 tablet by mouth at bedtime., Disp: , Rfl:    Magnesium Bisglycinate  (MAG GLYCINATE) 100 MG TABS, Take 2 tablets by mouth at bedtime., Disp: , Rfl:    methenamine (HIPREX) 1 g tablet, Take by mouth., Disp: , Rfl:    miconazole nitrate (MONISTAT) 200 & 2 MG-% (9GM) KIT, Place vaginally., Disp: , Rfl:    mirtazapine (REMERON) 30 MG tablet, Take 30 mg by mouth at bedtime., Disp: , Rfl:    NEXIUM 40 MG capsule, Take 40 mg by mouth daily. , Disp: , Rfl:    nitrofurantoin, macrocrystal-monohydrate, (MACROBID) 100 MG capsule, Take 100 mg by mouth 2 (two) times daily., Disp: , Rfl:    nystatin (MYCOSTATIN/NYSTOP) powder, Apply to skinfolds prone to moisture (groin, under the breasts, abdomen), Disp: , Rfl:    omeprazole (PRILOSEC) 20 MG capsule, Take by mouth., Disp: , Rfl:    ondansetron (ZOFRAN) 8 MG tablet, TAKE 1 TABLET BY MOUTH EVERY 8 HOURS AS NEEDED, Disp: 90 tablet, Rfl: 5   ondansetron (ZOFRAN-ODT) 8 MG disintegrating tablet, Take by mouth., Disp: , Rfl:    ondansetron (ZOFRAN-ODT) 8 MG disintegrating tablet, Take by mouth., Disp: , Rfl:    oxyCODONE (ROXICODONE) 5 MG immediate release tablet, Take 1 tablet (5 mg total) by mouth every 6 (six) hours as needed. To replace percocet- since having difficulties with tylenol, Disp: 120 tablet, Rfl: 0   phytonadione (VITAMIN K) 5 MG tablet, Take by mouth., Disp: , Rfl:    polyethylene glycol powder (GLYCOLAX/MIRALAX) 17 GM/SCOOP powder, Take 3 teaspoonsfuls daily for constipation, Disp: , Rfl:    potassium chloride SA (K-DUR) 20 MEQ tablet, Take by mouth. , Disp: , Rfl:    Probiotic Product (CULTURELLE IMMUNE DEFENSE) CAPS, Take by mouth., Disp: , Rfl:    Probiotic Product (PROBIOTIC BLEND PO), Take 1 capsule by mouth daily., Disp: , Rfl:    promethazine (PHENERGAN) 12.5 MG suppository, Place rectally., Disp: , Rfl:    promethazine (PHENERGAN) 25 MG tablet, Take 0.5-1 tablets (12.5-25 mg total) by mouth every 6 (six) hours as needed for nausea or vomiting (take instead of zofran regularly)., Disp: 120 tablet, Rfl: 1    pseudoephedrine (SUDAFED) 120 MG 12 hr tablet, Take by mouth., Disp: , Rfl:    sodium chloride irrigation 0.9 % irrigation, Irrigate with as directed., Disp: , Rfl:    Sodium Chloride, GU Irrigant, (0.9 % SODIUM CHLORIDE, POUR BTL, OPTIME), Irrigate with 1,000 mLs as directed daily., Disp: 10000 mL, Rfl: 11   SUMAtriptan 6 MG/0.5ML SOAJ, Inject into the skin., Disp: , Rfl:    tiZANidine (ZANAFLEX) 4 MG tablet, Take 1 tablet (4 mg total) by mouth every 6 (six) hours as needed for muscle spasms., Disp: 180 tablet, Rfl: 1   TRINTELLIX 5 MG TABS tablet,  Take 15 mg by mouth daily., Disp: , Rfl:    UBRELVY 50 MG TABS, Take by mouth., Disp: , Rfl:    Ubrogepant 100 MG TABS, Take one at onset of severe headache; may repeat once in 2 hours if headache persists; max of 2 in 24 hours, Disp: , Rfl:    vancomycin (VANCOCIN) 1 g injection, SMARTSIG:IV, Disp: , Rfl:    vancomycin (VANCOCIN) 125 MG capsule, Take by mouth., Disp: , Rfl:    vortioxetine HBr (TRINTELLIX) 20 MG TABS tablet, Take 20 mg by mouth 1 day or 1 dose., Disp: , Rfl:    warfarin (COUMADIN) 1 MG tablet, Take by mouth., Disp: , Rfl:    warfarin (COUMADIN) 2 MG tablet, Take by mouth., Disp: , Rfl:    warfarin (COUMADIN) 4 MG tablet, Take by mouth., Disp: , Rfl:    warfarin (COUMADIN) 6 MG tablet, Take by mouth., Disp: , Rfl:    Objective:     There were no vitals filed for this visit.    There is no height or weight on file to calculate BMI.    Physical Exam:    ***   Electronically signed by:  Aleen Sells [name] 7:40 AM 04/11/23

## 2023-04-12 ENCOUNTER — Ambulatory Visit: Payer: Medicare PPO | Admitting: Sports Medicine

## 2023-04-12 VITALS — BP 132/80 | HR 84 | Ht 70.0 in | Wt 320.0 lb

## 2023-04-12 DIAGNOSIS — G8221 Paraplegia, complete: Secondary | ICD-10-CM | POA: Diagnosis not present

## 2023-04-12 DIAGNOSIS — M542 Cervicalgia: Secondary | ICD-10-CM | POA: Diagnosis not present

## 2023-04-12 DIAGNOSIS — M9908 Segmental and somatic dysfunction of rib cage: Secondary | ICD-10-CM | POA: Diagnosis not present

## 2023-04-12 DIAGNOSIS — M9902 Segmental and somatic dysfunction of thoracic region: Secondary | ICD-10-CM | POA: Diagnosis not present

## 2023-04-12 DIAGNOSIS — M9901 Segmental and somatic dysfunction of cervical region: Secondary | ICD-10-CM | POA: Diagnosis not present

## 2023-04-12 NOTE — Patient Instructions (Signed)
Good to see you   

## 2023-04-18 ENCOUNTER — Encounter: Payer: Self-pay | Admitting: Physical Medicine and Rehabilitation

## 2023-04-18 ENCOUNTER — Encounter: Payer: Medicare PPO | Attending: Physical Medicine and Rehabilitation | Admitting: Physical Medicine and Rehabilitation

## 2023-04-18 DIAGNOSIS — G43119 Migraine with aura, intractable, without status migrainosus: Secondary | ICD-10-CM

## 2023-04-18 DIAGNOSIS — R252 Cramp and spasm: Secondary | ICD-10-CM

## 2023-04-18 DIAGNOSIS — M7918 Myalgia, other site: Secondary | ICD-10-CM | POA: Diagnosis present

## 2023-04-18 DIAGNOSIS — G894 Chronic pain syndrome: Secondary | ICD-10-CM | POA: Diagnosis present

## 2023-04-18 DIAGNOSIS — G8221 Paraplegia, complete: Secondary | ICD-10-CM

## 2023-04-18 DIAGNOSIS — Z993 Dependence on wheelchair: Secondary | ICD-10-CM | POA: Diagnosis present

## 2023-04-18 MED ORDER — DANTROLENE SODIUM 100 MG PO CAPS: 100.0000 mg | ORAL_CAPSULE | Freq: Three times a day (TID) | ORAL | 3 refills | Status: DC

## 2023-04-18 MED ORDER — BACLOFEN 20 MG PO TABS: 40.0000 mg | ORAL_TABLET | Freq: Three times a day (TID) | ORAL | 3 refills | Status: DC

## 2023-04-18 NOTE — Patient Instructions (Signed)
Pt is a 52 yr old female with T7 paraplegia, C Diff (+), frequent UTIs, autonomic dysfunction, Neurogenic bowel and bladder, spasticity and ileoconduit for bladder, multiple Stage IV pressure ulcers and heel pressure ulcers. Here for f/u.  For f/u on paraplegia and spasticity. Still has chronic osteomyelitis. Also has IBS. Poor nutrition S/p L kidney removal 5/22   SCI support group 04/21/23- 6-7 pm- Jason from [place] to talk about w/c's.    2.  PCP working on paperwork to get [employer] w/c modifications to be done.    3. Neurogenic bowel- Colonoscopy-  Most patients with SCI patient/neurogenic bowel- it takes usually 3 days to clean out SCI patients due to slowing of gut with neurogenic bowel- I ran the colonoscopy program at the SCI unit at [place] in [place] for 5 years- usually took 3-5 days to get patients cleaned out and on clear the whole time- so strongly suggest 3-4x/dose of miralax the night before taken to hospital for clean out/prep.   4.  Ask Trinna Post- PT if needs another OT eval to work on shower transfers- being naked/bare skin and transferring is pt's biggest concern.   5. Seeing Dr Jean Rosenthal for OMT- somewhat helpful.   6.  Might need to go to gym to get push up strength- but need to target upper traps, your triceps and lattismus dorsi- see if Trinna Post has any other ideas-  needs actual weights- start with 7.5 to 10 lbs weights- and can build up from there.  Push up from chair exercise; triceps- above head- and chest presses - can also push above head.  Demonstrated home exercise program.    7. Refilled Baclofen and Dantrolene-  1 year supplies. 40 mg TID and Dantrolene 100 mg TID- refilled.   8. Gets CMP at from PCP- Cmp last done 02/15/23. Looked good- AST/ALT in 12-13 range;   9.  F/U in 3 months- double appt- SCI

## 2023-04-18 NOTE — Progress Notes (Signed)
Subjective:    Patient ID: Rachael Turner, female    DOB: 1971/05/30, 52 y.o.   MRN: [id number]  HPI  HPI   Pt is a 52 yr old female with T7 paraplegia, C Diff (+), frequent UTIs, autonomic dysfunction, Neurogenic bowel and bladder, spasticity and ileoconduit for bladder, multiple Stage IV pressure ulcers and heel pressure ulcers. Here for f/u.  For f/u on paraplegia and spasticity. Still has chronic osteomyelitis. Also has IBS. 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   Got sick for 3 weeks- and husband also got sick- couldn't do HEP after first Dr Jean Rosenthal visit-   Restarted HEP And new w/c- is able to get w/c to lay flat, so could do OMT when in w/c.   Has Colonoscopy 5/3- going to get admitted to prep her 2 days prior.    Has reached out to [facility]- PT about adding more sessions for therapy.   Current w/c from [employer]- Supposed to get modifications to current w/c- new side pieces- due to feet supinating too bad- and when uses thigh pads adequately-then causes pressure on sides of lateral thighs; and can put ROHO on foot rest?   Got w/c from [employer]- 52 years old. Romeo Apple is her ATP.   Itching comes and goes- this weekend it was this weekend.  Also had a couple of bowel accidents- thinks tied into GI stuff.   Had been a lot less irritating there for awhile, and now eased back up again. Back down to more tolerable.    Needs Refills of Baclofen and Dantrolene. Has plenty of Pain meds/oxy- has 146 pills/left- last pain meds 2 weeks ago.  Has plenty of Valium   Bowel program- does every evening at home- usually takes Miralax- but spreads out over day. Doesn't have BSC- since cannot do transfer on her own.      Pain Inventory Average Pain 6 Pain Right Now 4 My pain is intermittent, sharp, dull, stabbing, and aching  LOCATION OF PAIN  head, neck, hand, back  BOWEL Number of stools per week: 3 Oral laxative use Yes  Type of laxative  miralax Enema or suppository use Yes  History of colostomy No  Incontinent No   BLADDER Ileal conduit In and out cath, frequency 4 hrs Able to self cath Yes  Bladder incontinence No  Frequent urination No  Leakage with coughing Yes  Difficulty starting stream No  Incomplete bladder emptying No    Mobility ability to climb steps?  no do you drive?  yes use a wheelchair needs help with transfers  Function not employed: date last employed 05/2009 disabled: date disabled 09/2007 I need assistance with the following:  dressing, bathing, and toileting  Neuro/Psych bowel control problems numbness tingling spasms  Prior Studies Any changes since last visit?  no  Physicians involved in your care Any changes since last visit?  no   No family history on file. Social History   Socioeconomic History   Marital status: Married    Spouse name: Not on file   Number of children: Not on file   Years of education: Not on file   Highest education level: Not on file  Occupational History   Not on file  Tobacco Use   Smoking status: Never   Smokeless tobacco: Never  Vaping Use   Vaping Use: Never used  Substance and Sexual Activity   Alcohol use: Not Currently    Comment: rare   Drug  use: Never   Sexual activity: Yes    Birth control/protection: Surgical  Other Topics Concern   Not on file  Social History Narrative   Not on file   Social Determinants of Health   Financial Resource Strain: Not on file  Food Insecurity: Not on file  Transportation Needs: Not on file  Physical Activity: Not on file  Stress: Not on file  Social Connections: Not on file   No past surgical history on file. Past Medical History:  Diagnosis Date   C. difficile diarrhea 07/2020   Complication of anesthesia    Difficult intubation    There were no vitals taken for this visit.  Opioid Risk Score:   Fall Risk Score:  `1  Depression screen PHQ 2/9     01/17/2023    2:44 PM  05/21/2022    1:17 PM 02/12/2022    1:47 PM 03/20/2021    1:11 PM 11/07/2020    1:46 PM 08/08/2020   11:47 AM 06/06/2020    2:05 PM  Depression screen PHQ 2/9  Decreased Interest 0 1 0 Down, Depressed, Hopeless 0 1 0 PHQ - 2 Score 0 2 0 Review of Systems  Gastrointestinal:  Positive for constipation, diarrhea and nausea.  Musculoskeletal:  Positive for back pain and neck pain.       Spasms Hand pain  Neurological:  Positive for numbness and headaches.  All other systems reviewed and are negative.     Objective:   Physical Exam  Awake, alert, appropriate, sitting in tilt in space in power w/c- phone on spring, NAD  Demonstrated how to do- HEP- pt had some difficulties MS: 0/5 in LE's 5/5 in Ue's B/L Neuro: increased tone MAS 1+ to 2 in LE's,  Wearing PRAFOs B/L     Assessment & Plan:   Pt is a 52 yr old female with T7 paraplegia, C Diff (+), frequent UTIs, autonomic dysfunction, Neurogenic bowel and bladder, spasticity and ileoconduit for bladder, multiple Stage IV pressure ulcers and heel pressure ulcers. Here for f/u.  For f/u on paraplegia and spasticity. Still has chronic osteomyelitis. Also has IBS. Poor nutrition S/p L kidney removal 5/22 Botox in upper traps,levators- she thinks it's helping. Migraines.    SCI support group 04/21/23- 6-7 pm- Jason from [place] to talk about w/c's.    2.  PCP working on paperwork to get [employer] w/c modifications to be done.    3. Neurogenic bowel- Colonoscopy-  Most patients with SCI patient/neurogenic bowel- it takes usually 3 days to clean out SCI patients due to slowing of gut with neurogenic bowel- I ran the colonoscopy program at the SCI unit at [place] in [place] for 5 years- usually took 3-5 days to get patients cleaned out and on clear the whole time- so strongly suggest 3-4x/dose of miralax the night before taken to hospital for clean out/prep.   4.  Ask Trinna Post- PT if needs another OT eval to work  on shower transfers- being naked/bare skin and transferring is pt's biggest concern.   5. Seeing Dr Jean Rosenthal for OMT- somewhat helpful.   6.  Might need to go to gym to get push up strength- but need to target upper traps, your triceps and lattismus dorsi- see if Trinna Post has any other ideas-  needs actual weights- start with 7.5 to 10 lbs weights- and can build up from there.  Push  up from chair exercise; triceps- above head- and chest presses - can also push above head.  Demonstrated home exercise program.    7. Refilled Baclofen and Dantrolene-  1 year supplies. 40 mg TID and Dantrolene 100 mg TID- refilled.   8. Gets CMP at from PCP- Cmp last done 02/15/23. Looked good- AST/ALT in 12-13 range;   9.  F/U in 3 months- double appt- SCI trP injections didn't help.     I spent a total of 42   minutes on total care today- >50% coordination of care- due to d/w pt about Migraines, spasticity, and HEP and colonoscopy.

## 2023-05-16 NOTE — Progress Notes (Unsigned)
Aleen Sells [name] [address] [place] [address] Phone: [phone]   Assessment and Plan:     There are no diagnoses linked to this encounter.  *** - Patient has received significant relief with OMT in the past.  Elects for repeat OMT today.  Tolerated well per note below. - Decision today to treat with OMT was based on Physical Exam   After verbal consent patient was treated with HVLA (high velocity low amplitude), ME (muscle energy), FPR (flex positional release), ST (soft tissue), PC/PD (Pelvic Compression/ Pelvic Decompression) techniques in cervical, rib, thoracic, lumbar, and pelvic areas. Patient tolerated the procedure well with improvement in symptoms.  Patient educated on potential side effects of soreness and recommended to rest, hydrate, and use Tylenol as needed for pain control.   Pertinent previous records reviewed include ***   Follow Up: ***     Subjective:   I, Nicholes Hibler, am serving as a Neurosurgeon for Doctor Richardean Sale   Chief Complaint: neck pain    HPI:    03/17/2023 Patient is a 52 year old female complaining of neck pain. Patient states that she has neck pain off and on for years, pain at the base of the skull and down to her neck and shoulders,  pain is affecting her sinus , migraines, nausea. she is worried there is an alignment issue in her neck , decreased ROM ,constant pain , she does get botox for migraines , she is having some spasticity as well , trigger point didn't help    04/12/2023 Patient states neck pain isnt bad , she is getting over a cold for a month   05/17/2023 Patient states    Relevant Historical Information: T7 SCI with paraplegia, autonomic dysfunction, wheelchair dependent, on anticoagulation chronically, status post left nephrectomy  Additional pertinent review of systems negative.  Current Outpatient Medications  Medication Sig Dispense Refill   adapalene (DIFFERIN) 0.1 % gel  Apply topically daily.     adapalene (DIFFERIN) 0.1 % gel Apply topically.     Amino Acids-Protein Hydrolys (PRO-STAT 101 PO) Take by mouth.     Aspirin-Caffeine 1000-65 MG PACK Take by mouth.     baclofen (LIORESAL) 20 MG tablet Take 2 tablets (40 mg total) by mouth 3 (three) times daily. 540 tablet 3   bisacodyl (DULCOLAX) 10 MG suppository Place rectally.     bisacodyl (DULCOLAX) 10 MG suppository Place rectally.     bisacodyl (DULCOLAX) 10 MG suppository Place rectally.     bismuth subsalicylate (PEPTO BISMOL) 262 MG/15ML suspension Take by mouth.     botulinum toxin Type A (BOTOX) 100 units SOLR injection Inject into the skin.     Botulinum Toxin Type A, Cosm, 100 units SOLR Inject into the skin.     calcium carbonate (TUMS EX) 750 MG chewable tablet Chew by mouth.     clindamycin (CLEOCIN T) 1 % lotion Apply topically.     cyclobenzaprine (FLEXERIL) 10 MG tablet      dantrolene (DANTRIUM) 100 MG capsule Take 1 capsule (100 mg total) by mouth 3 (three) times daily. 270 capsule 3   diazepam (VALIUM) 5 MG tablet TAKE 1 TABLET BY MOUTH EVERY 12 HOURS AS NEEDED FOR MUSCLE SPASMS 60 tablet 5   diphenhydrAMINE (SOMINEX) 25 MG tablet Take by mouth.     Docusate Sodium (DSS) 100 MG CAPS Take by mouth.     guaiFENesin (MUCINEX) 600 MG 12 hr tablet Take by mouth.  loperamide (IMODIUM A-D) 2 MG tablet Take by mouth.     loperamide (IMODIUM A-D) 2 MG tablet Take by mouth.     Magnesium Bisglycinate (MAG GLYCINATE) 100 MG TABS Take 2 tablets by mouth at bedtime.     methenamine (HIPREX) 1 g tablet Take by mouth.     miconazole nitrate (MONISTAT) 200 & 2 MG-% (9GM) KIT Place vaginally.     mirtazapine (REMERON) 30 MG tablet Take 30 mg by mouth at bedtime.     NEXIUM 40 MG capsule Take 40 mg by mouth daily.      nystatin (MYCOSTATIN/NYSTOP) powder Apply to skinfolds prone to moisture (groin, under the breasts, abdomen)     omeprazole (PRILOSEC) 20 MG capsule Take by mouth.     ondansetron  (ZOFRAN-ODT) 8 MG disintegrating tablet Take by mouth.     oxyCODONE (ROXICODONE) 5 MG immediate release tablet Take 1 tablet (5 mg total) by mouth every 6 (six) hours as needed. To replace percocet- since having difficulties with tylenol 120 tablet 0   phytonadione (VITAMIN K) 5 MG tablet Take by mouth.     polyethylene glycol powder (GLYCOLAX/MIRALAX) 17 GM/SCOOP powder Take 3 teaspoonsfuls daily for constipation     potassium chloride SA (K-DUR) 20 MEQ tablet Take by mouth.      Probiotic Product (PROBIOTIC BLEND PO) Take 1 capsule by mouth daily.     promethazine (PHENERGAN) 25 MG tablet Take 0.5-1 tablets (12.5-25 mg total) by mouth every 6 (six) hours as needed for nausea or vomiting (take instead of zofran regularly). 120 tablet 1   pseudoephedrine (SUDAFED) 120 MG 12 hr tablet Take by mouth.     sodium chloride irrigation 0.9 % irrigation Irrigate with as directed.     Sodium Chloride, GU Irrigant, (0.9 % SODIUM CHLORIDE, POUR BTL, OPTIME) Irrigate with 1,000 mLs as directed daily. 10000 mL 11   SUMAtriptan 6 MG/0.5ML SOAJ Inject into the skin.     tiZANidine (ZANAFLEX) 4 MG tablet Take 1 tablet (4 mg total) by mouth every 6 (six) hours as needed for muscle spasms. 180 tablet 1   TRINTELLIX 5 MG TABS tablet Take 15 mg by mouth daily.     UBRELVY 50 MG TABS Take by mouth.     Ubrogepant 100 MG TABS Take one at onset of severe headache; may repeat once in 2 hours if headache persists; max of 2 in 24 hours     vortioxetine HBr (TRINTELLIX) 20 MG TABS tablet Take 20 mg by mouth 1 day or 1 dose.     warfarin (COUMADIN) 1 MG tablet Take by mouth.     warfarin (COUMADIN) 2 MG tablet Take by mouth.     warfarin (COUMADIN) 4 MG tablet Take by mouth.     warfarin (COUMADIN) 6 MG tablet Take by mouth.     No current facility-administered medications for this visit.      Objective:     There were no vitals filed for this visit.    There is no height or weight on file to calculate BMI.     Physical Exam:     General: Well-appearing, cooperative, sitting comfortably in no acute distress.   OMT Physical Exam:  ASIS Compression Test: Positive Right Cervical: TTP paraspinal, *** Rib: Bilateral elevated first rib with TTP Thoracic: TTP paraspinal,*** Lumbar: TTP paraspinal,*** Pelvis: Right anterior innominate  Electronically signed by:  Aleen [facility]ells [name] 11:44 AM 05/16/23

## 2023-05-17 ENCOUNTER — Ambulatory Visit: Payer: Medicare PPO | Admitting: Sports Medicine

## 2023-05-17 VITALS — BP 132/80 | HR 78 | Ht 70.0 in | Wt 320.0 lb

## 2023-05-17 DIAGNOSIS — M9908 Segmental and somatic dysfunction of rib cage: Secondary | ICD-10-CM

## 2023-05-17 DIAGNOSIS — M9902 Segmental and somatic dysfunction of thoracic region: Secondary | ICD-10-CM

## 2023-05-17 DIAGNOSIS — G8221 Paraplegia, complete: Secondary | ICD-10-CM

## 2023-05-17 DIAGNOSIS — M9901 Segmental and somatic dysfunction of cervical region: Secondary | ICD-10-CM

## 2023-05-17 DIAGNOSIS — M542 Cervicalgia: Secondary | ICD-10-CM | POA: Diagnosis not present

## 2023-06-13 NOTE — Progress Notes (Unsigned)
Rachael Turner [facility] [address] [place] [address] Phone: [phone]   Assessment and Plan:     There are no diagnoses linked to this encounter.  *** - Patient has received relief with OMT in the past.  Elects for repeat OMT today.  Tolerated well per note below. - Decision today to treat with OMT was based on Physical Exam   After verbal consent patient was treated with HVLA (high velocity low amplitude), ME (muscle energy), FPR (flex positional release), ST (soft tissue), PC/PD (Pelvic Compression/ Pelvic Decompression) techniques in cervical, rib, thoracic, lumbar, and pelvic areas. Patient tolerated the procedure well with improvement in symptoms.  Patient educated on potential side effects of soreness and recommended to rest, hydrate, and use Tylenol as needed for pain control.   Pertinent previous records reviewed include ***   Follow Up: ***     Subjective:   I, Rachael Turner, am serving as a Neurosurgeon for Doctor Rachael Turner   Chief Complaint: neck pain    HPI:    03/17/2023 Patient is a 52 year old female complaining of neck pain. Patient states that she has neck pain off and on for years, pain at the base of the skull and down to her neck and shoulders,  pain is affecting her sinus , migraines, nausea. she is worried there is an alignment issue in her neck , decreased ROM ,constant pain , she does get botox for migraines , she is having some spasticity as well , trigger point didn't help    04/12/2023 Patient states neck pain isnt bad , she is getting over a cold for a month   05/17/2023 Patient states that she has had several migraines back to back    06/14/2023 Patient states  Relevant Historical Information: T7 SCI with paraplegia, autonomic dysfunction, wheelchair dependent, on anticoagulation chronically, status post left nephrectomy  Additional pertinent review of systems negative.  Current Outpatient Medications   Medication Sig Dispense Refill   adapalene (DIFFERIN) 0.1 % gel Apply topically daily.     adapalene (DIFFERIN) 0.1 % gel Apply topically.     Amino Acids-Protein Hydrolys (PRO-STAT 101 PO) Take by mouth.     Aspirin-Caffeine 1000-65 MG PACK Take by mouth.     baclofen (LIORESAL) 20 MG tablet Take 2 tablets (40 mg total) by mouth 3 (three) times daily. 540 tablet 3   bisacodyl (DULCOLAX) 10 MG suppository Place rectally.     bisacodyl (DULCOLAX) 10 MG suppository Place rectally.     bisacodyl (DULCOLAX) 10 MG suppository Place rectally.     bismuth subsalicylate (PEPTO BISMOL) 262 MG/15ML suspension Take by mouth.     botulinum toxin Type A (BOTOX) 100 units SOLR injection Inject into the skin.     Botulinum Toxin Type A, Cosm, 100 units SOLR Inject into the skin.     calcium carbonate (TUMS EX) 750 MG chewable tablet Chew by mouth.     clindamycin (CLEOCIN T) 1 % lotion Apply topically.     cyclobenzaprine (FLEXERIL) 10 MG tablet      dantrolene (DANTRIUM) 100 MG capsule Take 1 capsule (100 mg total) by mouth 3 (three) times daily. 270 capsule 3   diazepam (VALIUM) 5 MG tablet TAKE 1 TABLET BY MOUTH EVERY 12 HOURS AS NEEDED FOR MUSCLE SPASMS 60 tablet 5   diphenhydrAMINE (SOMINEX) 25 MG tablet Take by mouth.     Docusate Sodium (DSS) 100 MG CAPS Take by mouth.  guaiFENesin (MUCINEX) 600 MG 12 hr tablet Take by mouth.     loperamide (IMODIUM A-D) 2 MG tablet Take by mouth.     loperamide (IMODIUM A-D) 2 MG tablet Take by mouth.     Magnesium Bisglycinate (MAG GLYCINATE) 100 MG TABS Take 2 tablets by mouth at bedtime.     methenamine (HIPREX) 1 g tablet Take by mouth.     miconazole nitrate (MONISTAT) 200 & 2 MG-% (9GM) KIT Place vaginally.     mirtazapine (REMERON) 30 MG tablet Take 30 mg by mouth at bedtime.     NEXIUM 40 MG capsule Take 40 mg by mouth daily.      nystatin (MYCOSTATIN/NYSTOP) powder Apply to skinfolds prone to moisture (groin, under the breasts, abdomen)      omeprazole (PRILOSEC) 20 MG capsule Take by mouth.     ondansetron (ZOFRAN-ODT) 8 MG disintegrating tablet Take by mouth.     oxyCODONE (ROXICODONE) 5 MG immediate release tablet Take 1 tablet (5 mg total) by mouth every 6 (six) hours as needed. To replace percocet- since having difficulties with tylenol 120 tablet 0   phytonadione (VITAMIN K) 5 MG tablet Take by mouth.     polyethylene glycol powder (GLYCOLAX/MIRALAX) 17 GM/SCOOP powder Take 3 teaspoonsfuls daily for constipation     potassium chloride SA (K-DUR) 20 MEQ tablet Take by mouth.      Probiotic Product (PROBIOTIC BLEND PO) Take 1 capsule by mouth daily.     promethazine (PHENERGAN) 25 MG tablet Take 0.5-1 tablets (12.5-25 mg total) by mouth every 6 (six) hours as needed for nausea or vomiting (take instead of zofran regularly). 120 tablet 1   pseudoephedrine (SUDAFED) 120 MG 12 hr tablet Take by mouth.     sodium chloride irrigation 0.9 % irrigation Irrigate with as directed.     Sodium Chloride, GU Irrigant, (0.9 % SODIUM CHLORIDE, POUR BTL, OPTIME) Irrigate with 1,000 mLs as directed daily. 10000 mL 11   SUMAtriptan 6 MG/0.5ML SOAJ Inject into the skin.     tiZANidine (ZANAFLEX) 4 MG tablet Take 1 tablet (4 mg total) by mouth every 6 (six) hours as needed for muscle spasms. 180 tablet 1   TRINTELLIX 5 MG TABS tablet Take 15 mg by mouth daily.     UBRELVY 50 MG TABS Take by mouth.     Ubrogepant 100 MG TABS Take one at onset of severe headache; may repeat once in 2 hours if headache persists; max of 2 in 24 hours     vortioxetine HBr (TRINTELLIX) 20 MG TABS tablet Take 20 mg by mouth 1 day or 1 dose.     warfarin (COUMADIN) 1 MG tablet Take by mouth.     warfarin (COUMADIN) 2 MG tablet Take by mouth.     warfarin (COUMADIN) 4 MG tablet Take by mouth.     warfarin (COUMADIN) 6 MG tablet Take by mouth.     No current facility-administered medications for this visit.      Objective:     There were no vitals filed for this visit.     There is no height or weight on file to calculate BMI.    Physical Exam:     General: Well-appearing, cooperative, sitting comfortably in no acute distress.   OMT Physical Exam:  ASIS Compression Test: Positive Right Cervical: TTP paraspinal, *** Rib: Bilateral elevated first rib with TTP Thoracic: TTP paraspinal,*** Lumbar: TTP paraspinal,*** Pelvis: Right anterior innominate  Electronically signed by:  Rachael Turner D.O.  [facility] 10:27 AM 06/13/23

## 2023-06-14 ENCOUNTER — Ambulatory Visit: Payer: Medicare PPO | Admitting: Sports Medicine

## 2023-06-14 VITALS — HR 86 | Ht 70.0 in | Wt 320.0 lb

## 2023-06-14 DIAGNOSIS — M9902 Segmental and somatic dysfunction of thoracic region: Secondary | ICD-10-CM

## 2023-06-14 DIAGNOSIS — M542 Cervicalgia: Secondary | ICD-10-CM | POA: Diagnosis not present

## 2023-06-14 DIAGNOSIS — M9908 Segmental and somatic dysfunction of rib cage: Secondary | ICD-10-CM

## 2023-06-14 DIAGNOSIS — M9901 Segmental and somatic dysfunction of cervical region: Secondary | ICD-10-CM

## 2023-06-14 DIAGNOSIS — G8221 Paraplegia, complete: Secondary | ICD-10-CM | POA: Diagnosis not present

## 2023-07-11 NOTE — Progress Notes (Unsigned)
Rachael Turner [name] [address] [place] [address] Phone: [phone]   Assessment and Plan:     There are no diagnoses linked to this encounter.  *** - Patient has received relief with OMT in the past.  Elects for repeat OMT today.  Tolerated well per note below. - Decision today to treat with OMT was based on Physical Exam   After verbal consent patient was treated with HVLA (high velocity low amplitude), ME (muscle energy), FPR (flex positional release), ST (soft tissue), PC/PD (Pelvic Compression/ Pelvic Decompression) techniques in cervical, rib, thoracic, lumbar, and pelvic areas. Patient tolerated the procedure well with improvement in symptoms.  Patient educated on potential side effects of soreness and recommended to rest, hydrate, and use Tylenol as needed for pain control.   Pertinent previous records reviewed include ***   Follow Up: ***     Subjective:   I, Rachael Turner, am serving as a Neurosurgeon for Doctor Richardean Sale   Chief Complaint: neck pain    HPI:    03/17/2023 Patient is a 52 year old female complaining of neck pain. Patient states that she has neck pain off and on for years, pain at the base of the skull and down to her neck and shoulders,  pain is affecting her sinus , migraines, nausea. she is worried there is an alignment issue in her neck , decreased ROM ,constant pain , she does get botox for migraines , she is having some spasticity as well , trigger point didn't help    04/12/2023 Patient states neck pain isnt bad , she is getting over a cold for a month   05/17/2023 Patient states that she has had several migraines back to back    06/14/2023 Patient states she has been doing pretty well , is having reaction to phentermine with spasticity and muscle spasm in the neck area  07/12/2023 Patient states    Relevant Historical Information: T7 SCI with paraplegia, autonomic dysfunction, wheelchair dependent, on  anticoagulation chronically, status post left nephrectomy  Additional pertinent review of systems negative.  Current Outpatient Medications  Medication Sig Dispense Refill   adapalene (DIFFERIN) 0.1 % gel Apply topically daily.     adapalene (DIFFERIN) 0.1 % gel Apply topically.     Amino Acids-Protein Hydrolys (PRO-STAT 101 PO) Take by mouth.     Aspirin-Caffeine 1000-65 MG PACK Take by mouth.     baclofen (LIORESAL) 20 MG tablet Take 2 tablets (40 mg total) by mouth 3 (three) times daily. 540 tablet 3   bisacodyl (DULCOLAX) 10 MG suppository Place rectally.     bisacodyl (DULCOLAX) 10 MG suppository Place rectally.     bisacodyl (DULCOLAX) 10 MG suppository Place rectally.     bismuth subsalicylate (PEPTO BISMOL) 262 MG/15ML suspension Take by mouth.     botulinum toxin Type A (BOTOX) 100 units SOLR injection Inject into the skin.     Botulinum Toxin Type A, Cosm, 100 units SOLR Inject into the skin.     calcium carbonate (TUMS EX) 750 MG chewable tablet Chew by mouth.     clindamycin (CLEOCIN T) 1 % lotion Apply topically.     cyclobenzaprine (FLEXERIL) 10 MG tablet      dantrolene (DANTRIUM) 100 MG capsule Take 1 capsule (100 mg total) by mouth 3 (three) times daily. 270 capsule 3   diazepam (VALIUM) 5 MG tablet TAKE 1 TABLET BY MOUTH EVERY 12 HOURS AS NEEDED FOR MUSCLE SPASMS 60 tablet  5   diphenhydrAMINE (SOMINEX) 25 MG tablet Take by mouth.     Docusate Sodium (DSS) 100 MG CAPS Take by mouth.     guaiFENesin (MUCINEX) 600 MG 12 hr tablet Take by mouth.     loperamide (IMODIUM A-D) 2 MG tablet Take by mouth.     loperamide (IMODIUM A-D) 2 MG tablet Take by mouth.     Magnesium Bisglycinate (MAG GLYCINATE) 100 MG TABS Take 2 tablets by mouth at bedtime.     methenamine (HIPREX) 1 g tablet Take by mouth.     miconazole nitrate (MONISTAT) 200 & 2 MG-% (9GM) KIT Place vaginally.     mirtazapine (REMERON) 30 MG tablet Take 30 mg by mouth at bedtime.     NEXIUM 40 MG capsule Take 40 mg  by mouth daily.      nystatin (MYCOSTATIN/NYSTOP) powder Apply to skinfolds prone to moisture (groin, under the breasts, abdomen)     omeprazole (PRILOSEC) 20 MG capsule Take by mouth.     ondansetron (ZOFRAN-ODT) 8 MG disintegrating tablet Take by mouth.     oxyCODONE (ROXICODONE) 5 MG immediate release tablet Take 1 tablet (5 mg total) by mouth every 6 (six) hours as needed. To replace percocet- since having difficulties with tylenol 120 tablet 0   phentermine (ADIPEX-P) 37.5 MG tablet Take 37.5 mg by mouth daily before breakfast.     phytonadione (VITAMIN K) 5 MG tablet Take by mouth.     polycarbophil (FIBERCON) 625 MG tablet Take 625 mg by mouth daily.     polyethylene glycol powder (GLYCOLAX/MIRALAX) 17 GM/SCOOP powder Take 3 teaspoonsfuls daily for constipation     potassium chloride SA (K-DUR) 20 MEQ tablet Take by mouth.      Probiotic Product (PROBIOTIC BLEND PO) Take 1 capsule by mouth daily.     promethazine (PHENERGAN) 25 MG tablet Take 0.5-1 tablets (12.5-25 mg total) by mouth every 6 (six) hours as needed for nausea or vomiting (take instead of zofran regularly). 120 tablet 1   pseudoephedrine (SUDAFED) 120 MG 12 hr tablet Take by mouth.     sodium chloride irrigation 0.9 % irrigation Irrigate with as directed.     Sodium Chloride, GU Irrigant, (0.9 % SODIUM CHLORIDE, POUR BTL, OPTIME) Irrigate with 1,000 mLs as directed daily. 10000 mL 11   SUMAtriptan 6 MG/0.5ML SOAJ Inject into the skin.     tiZANidine (ZANAFLEX) 4 MG tablet Take 1 tablet (4 mg total) by mouth every 6 (six) hours as needed for muscle spasms. 180 tablet 1   TRINTELLIX 5 MG TABS tablet Take 15 mg by mouth daily.     UBRELVY 50 MG TABS Take by mouth.     Ubrogepant 100 MG TABS Take one at onset of severe headache; may repeat once in 2 hours if headache persists; max of 2 in 24 hours     vortioxetine HBr (TRINTELLIX) 20 MG TABS tablet Take 20 mg by mouth 1 day or 1 dose.     warfarin (COUMADIN) 1 MG tablet Take by  mouth.     warfarin (COUMADIN) 2 MG tablet Take by mouth.     warfarin (COUMADIN) 4 MG tablet Take by mouth.     warfarin (COUMADIN) 6 MG tablet Take by mouth.     No current facility-administered medications for this visit.      Objective:     There were no vitals filed for this visit.    There is no height or weight on file to  calculate BMI.    Physical Exam:     General: Well-appearing, cooperative, sitting comfortably in no acute distress.   OMT Physical Exam:  ASIS Compression Test: Positive Right Cervical: TTP paraspinal, *** Rib: Bilateral elevated first rib with TTP Thoracic: TTP paraspinal,*** Lumbar: TTP paraspinal,*** Pelvis: Right anterior innominate  Electronically signed by:  Rachael Turner [name] 2:17 PM 07/11/23

## 2023-07-12 ENCOUNTER — Ambulatory Visit: Payer: Medicare PPO | Admitting: Sports Medicine

## 2023-07-12 VITALS — HR 79 | Ht 70.0 in | Wt 320.0 lb

## 2023-07-12 DIAGNOSIS — G43119 Migraine with aura, intractable, without status migrainosus: Secondary | ICD-10-CM

## 2023-07-12 DIAGNOSIS — M9908 Segmental and somatic dysfunction of rib cage: Secondary | ICD-10-CM

## 2023-07-12 DIAGNOSIS — M9902 Segmental and somatic dysfunction of thoracic region: Secondary | ICD-10-CM

## 2023-07-12 DIAGNOSIS — G8221 Paraplegia, complete: Secondary | ICD-10-CM | POA: Diagnosis not present

## 2023-07-12 DIAGNOSIS — M9901 Segmental and somatic dysfunction of cervical region: Secondary | ICD-10-CM | POA: Diagnosis not present

## 2023-07-12 DIAGNOSIS — M542 Cervicalgia: Secondary | ICD-10-CM

## 2023-07-18 ENCOUNTER — Encounter: Payer: Self-pay | Admitting: Physical Medicine and Rehabilitation

## 2023-07-18 ENCOUNTER — Encounter: Payer: Medicare PPO | Attending: Physical Medicine and Rehabilitation | Admitting: Physical Medicine and Rehabilitation

## 2023-07-18 VITALS — BP 128/79 | HR 84 | Ht 70.0 in

## 2023-07-18 DIAGNOSIS — G8220 Paraplegia, unspecified: Secondary | ICD-10-CM

## 2023-07-18 DIAGNOSIS — Z993 Dependence on wheelchair: Secondary | ICD-10-CM | POA: Diagnosis present

## 2023-07-18 DIAGNOSIS — G8221 Paraplegia, complete: Secondary | ICD-10-CM | POA: Diagnosis present

## 2023-07-18 DIAGNOSIS — N390 Urinary tract infection, site not specified: Secondary | ICD-10-CM | POA: Insufficient documentation

## 2023-07-18 DIAGNOSIS — K589 Irritable bowel syndrome without diarrhea: Secondary | ICD-10-CM | POA: Diagnosis not present

## 2023-07-18 DIAGNOSIS — M7918 Myalgia, other site: Secondary | ICD-10-CM | POA: Diagnosis present

## 2023-07-18 DIAGNOSIS — R252 Cramp and spasm: Secondary | ICD-10-CM | POA: Insufficient documentation

## 2023-07-18 DIAGNOSIS — M8660 Other chronic osteomyelitis, unspecified site: Secondary | ICD-10-CM | POA: Diagnosis not present

## 2023-07-18 DIAGNOSIS — G894 Chronic pain syndrome: Secondary | ICD-10-CM | POA: Insufficient documentation

## 2023-07-18 DIAGNOSIS — G43119 Migraine with aura, intractable, without status migrainosus: Secondary | ICD-10-CM | POA: Insufficient documentation

## 2023-07-18 DIAGNOSIS — Z87440 Personal history of urinary (tract) infections: Secondary | ICD-10-CM

## 2023-07-18 NOTE — Patient Instructions (Signed)
Pt is a 52 yr old female with T7 paraplegia, C Diff (+), frequent UTIs, autonomic dysfunction, Neurogenic bowel and bladder, spasticity and ileoconduit for bladder, multiple Stage IV pressure ulcers and heel pressure ulcers. Here for f/u.  For f/u on paraplegia and spasticity. Still has chronic osteomyelitis. Also has IBS. 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   2. Trigger point injections didn't help.    3.  Try to get Valium and pain meds from me- since insurance can cause problems. For itching. Which I think is neuropathic pain.   4.  For frequent UTI's- Can try Vinegar saline solution AND methamine oral to try and reduce UTI's make more acidic urine.  Apple cider vinegar vs white vinegar- food safe vinegar-  1 tablespoon in 1 liter saline- and can reduce- at least 60cc to irrigate. 1x/week or so.  Sterile syringe- and saline, not water.   5. Con't Baclofen and Dantrolene- doesn't need refills. Has 9 months left.    86. W/C is 52 years old- cushion is 103/28 to 52 year old- doesn't need replacement yet.   7. SCI support group- 6-7 pm last Thursday of the month.   8.  Migraine frequency needs to come down-  still doing Botox- rescue- Imitrex and Nurtec- great for classic migraines- but not as good for "neck migraines"- and Ajovy for migraine prevention as well.   9. Discussed Oxy doesn't really help neck pain- nor does Zanaflex, and thinks it' sbeen triggered by gut!-   10. Migraines- Try a lidocaine patch up high- can also double up tennis balls- and tape together and hold pressure against w/c head rest.    11. F?U in 3 months- double appt- SCI

## 2023-07-18 NOTE — Progress Notes (Signed)
Subjective:    Patient ID: Rachael Turner, female    DOB: [birth year], 52 y.o.   MRN: [id number]  HPI  Pt is a 52 yr old female with T7 paraplegia, C Diff (+), frequent UTIs, autonomic dysfunction, Neurogenic bowel and bladder, spasticity and ileoconduit for bladder, multiple Stage IV pressure ulcers and heel pressure ulcers. Here for f/u.  For f/u on paraplegia and spasticity. Still has chronic osteomyelitis. Also has IBS. 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    Got colonoscopy- still couldn't see everything.  4pm til got air bed.  Couldn't see everything. -need to do in 2 years .    Has 135 pills left- 146 pills left at time prior to that- so not taking much of Oxycodone!  Not much at all.  A lot of GI issues- thinks med issues?    Phentermine- caused gastritis- constant nausea and referred pain and a lot of gas, and muscle spasms and migraines because in such fflare right now.   Thinks methanamine is one of medicine cusing problems, but cannot stop it.  Taking less to "cause a meltdown".    Hasn't made SCI support group lately- .  Has been too tired to come.   Hasn't been back to see Trinna Post at [employer] [facility].  Husband been so tired- not practicing- toilet transfers.  Started making a plan so can work on Nurse, learning disability.   Drove down here today.   Sees Dr Jean Rosenthal 1x/month- he suggested seeing doctor in [place].  Noticing upper cervical pain is somewhat better Got a bit of migraine afterwards x2.  Clenches muscles back up- when drives back.  He said he was trained by doctor's in [place]- and that might help pain control better.   Did have PCP refill Valium- and she was OK to do so.   Taking Valium- really helpful when back itching.  2-3x/week.   Working on weight loss- with phenteramine-   Takes methaamine q12 hours and needs to take with food- trying to take with food, but told to take with Vit C- no dairy products.     And MVI load with Vit C, so upset stomach so much.  Tried Vinegar irrigation- of bladder- with saline-   Doing strengthening tasks at home right now- therabands- don't do everything she needs to do.   Rest of wounds closed except R heel-  Horrible time with LE swelling-  In unna boot on RLE-  Neoprene sleeve on LLE    Pain Inventory Average Pain 6 Pain Right Now 6 My pain is intermittent, sharp, dull, stabbing, tingling, and aching  In the last 24 hours, has pain interfered with the following? General activity 8 Relation with others 2 Enjoyment of life 8 What TIME of day is your pain at its worst? night Sleep (in general) NA  Pain is worse with: some activites Pain improves with: rest, heat/ice, therapy/exercise, medication, and TENS Relief from Meds:  na  No family history on file. Social History   Socioeconomic History   Marital status: Married    Spouse name: Not on file   Number of children: Not on file   Years of education: Not on file   Highest education level: Not on file  Occupational History   Not on file  Tobacco Use   Smoking status: Never   Smokeless tobacco: Never  Vaping Use   Vaping status: Never Used  Substance and Sexual Activity  Alcohol use: Not Currently    Comment: rare   Drug use: Never   Sexual activity: Yes    Birth control/protection: Surgical  Other Topics Concern   Not on file  Social History Narrative   Not on file   Social Determinants of Health   Financial Resource Strain: Not on file  Food Insecurity: No Food Insecurity (09/29/2022)   [facility] to [facility]26/2023.   Hunger Vital Sign    Worried About Running Out of Food in the Last Year: Never true    Ran Out of Food in the Last Year: Never true  Transportation Needs: No Transportation Needs (09/29/2022)   [facility], [facility] prior to 02/26/2023.,  [facility]   PRAPARE - Transportation    Lack of Transportation (Non-Medical): No    Lack of Transportation (Medical): No  Physical Activity: Not on file  Stress: Not on file  Social Connections: Unknown (05/07/2022)   Received from [facility], [facility]   Social Network    Social Network: Not on file   No past surgical history on file. No past surgical history on file. Past Medical History:  Diagnosis Date   C. difficile diarrhea 07/2020   Complication of anesthesia    Difficult intubation    BP 128/79   Pulse 84   Ht 5\' 10"  (1.778 m)   SpO2 98%   BMI 45.92 kg/m   Opioid Risk Score:   Fall Risk Score:  `1  Depression screen [facility] 2/9     07/18/2023    2:53 PM 01/17/2023    2:44 PM 05/21/2022    1:17 PM 02/12/2022    1:47 PM 03/20/2021    1:11 PM 11/07/2020    1:46 PM 08/08/2020   11:47 AM  Depression screen [facility] 2/9  Decreased Interest 1 0 1 0 1 1 3   Down, Depressed, Hopeless 1 0 1 0 1 1 3   PHQ - 2 Score 2 0 2 0 2 2 6      Review of Systems  Constitutional: Negative.   HENT: Negative.    Eyes: Negative.   Respiratory: Negative.    Cardiovascular: Negative.   Gastrointestinal: Negative.   Endocrine: Negative.   Genitourinary: Negative.   Musculoskeletal:        Bilateral wrist pain, bilateral shoulders and upper abdomen  Skin: Negative.   Allergic/Immunologic: Negative.   Neurological:  Positive for headaches.  Hematological: Negative.   Psychiatric/Behavioral: Negative.    All other systems reviewed and are negative.      Objective:   Physical Exam  Awake, alert, appropriate, NAD In power w/c- weight- BMI rising slightly;  0/5 in LE's- MS Neuro; absent sensation below waist      Assessment & Plan:    Pt is a 52 yr old female with T7 paraplegia, C Diff (+), frequent UTIs, autonomic dysfunction, Neurogenic bowel and bladder, spasticity and ileoconduit for bladder, multiple Stage IV pressure ulcers and heel pressure ulcers. Here for f/u.   For f/u on paraplegia and spasticity. Still has chronic osteomyelitis. Also has IBS. 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   2. Trigger point injections didn't help.    3.  Try to get Valium and pain meds from me- since insurance can cause problems. For itching. Which I think is neuropathic pain.  4.  For frequent UTI's- Can try Vinegar saline solution AND methamine oral to try and reduce UTI's make more acidic urine.  Apple cider vinegar vs white vinegar- food safe vinegar-  1 tablespoon in 1 liter saline- and can reduce- at least 60cc to irrigate. 1x/week or so.  Sterile syringe- and saline, not water.   5. Con't Baclofen and Dantrolene- doesn't need refills. Has 9 months left.    57. W/C is 52 years old- cushion is 13/13 to 52 year old- doesn't need replacement yet.   7. SCI support group- 6-7 pm last Thursday of the month.   8.  Migraine frequency needs to come down-  still doing Botox- rescue- Imitrex and Nurtec- great for classic migraines- but not as good for "neck migraines"- and Ajovy for migraine prevention as well.   9. Discussed Oxy doesn't really help neck pain- nor does Zanaflex, and thinks it' sbeen triggered by gut!-   10. Migraines- Try a lidocaine patch up high- can also double up tennis balls- and tape together and hold pressure against w/c head rest.    11. F?U in 3 months- double appt- SCI  I spent a total of  43   minutes on total care today- >50% coordination of care- due to  Discussed migraines, pain meds- w/c- frequent UTI's-  and CMP- for spasticity Mickle Plumb

## 2023-08-09 ENCOUNTER — Ambulatory Visit: Payer: Medicare PPO | Admitting: Sports Medicine

## 2023-08-10 ENCOUNTER — Ambulatory Visit: Payer: Medicare PPO | Admitting: Sports Medicine

## 2023-08-10 NOTE — Progress Notes (Deleted)
Aleen Sells D.[name] [address] [place] [address] Phone: [phone]   Assessment and Plan:     There are no diagnoses linked to this encounter.  *** - Patient has received relief with OMT in the past.  Elects for repeat OMT today.  Tolerated well per note below. - Decision today to treat with OMT was based on Physical Exam   After verbal consent patient was treated with HVLA (high velocity low amplitude), ME (muscle energy), FPR (flex positional release), ST (soft tissue), PC/PD (Pelvic Compression/ Pelvic Decompression) techniques in cervical, rib, thoracic, lumbar, and pelvic areas. Patient tolerated the procedure well with improvement in symptoms.  Patient educated on potential side effects of soreness and recommended to rest, hydrate, and use Tylenol as needed for pain control.   Pertinent previous records reviewed include ***   Follow Up: ***     Subjective:   I, Shant Hence, am serving as a Neurosurgeon for Doctor Richardean Sale   Chief Complaint: neck pain    HPI:    03/17/2023 Patient is a 52 year old female complaining of neck pain. Patient states that she has neck pain off and on for years, pain at the base of the skull and down to her neck and shoulders,  pain is affecting her sinus , migraines, nausea. she is worried there is an alignment issue in her neck , decreased ROM ,constant pain , she does get botox for migraines , she is having some spasticity as well , trigger point didn't help    04/12/2023 Patient states neck pain isnt bad , she is getting over a cold for a month   05/17/2023 Patient states that she has had several migraines back to back    06/14/2023 Patient states she has been doing pretty well , is having reaction to phentermine with spasticity and muscle spasm in the neck area   07/12/2023 Patient states she has had some stomach pain that has caused a flare throughout the neck and back pain    08/11/2023 Patient  states   Relevant Historical Information: T7 SCI with paraplegia, autonomic dysfunction, wheelchair dependent, on anticoagulation chronically, status post left nephrectomy  Additional pertinent review of systems negative.  Current Outpatient Medications  Medication Sig Dispense Refill   adapalene (DIFFERIN) 0.1 % gel Apply topically daily.     adapalene (DIFFERIN) 0.1 % gel Apply topically.     Amino Acids-Protein Hydrolys (PRO-STAT 101 PO) Take by mouth.     Aspirin-Caffeine 1000-65 MG PACK Take by mouth.     baclofen (LIORESAL) 20 MG tablet Take 2 tablets (40 mg total) by mouth 3 (three) times daily. 540 tablet 3   bisacodyl (DULCOLAX) 10 MG suppository Place rectally.     bisacodyl (DULCOLAX) 10 MG suppository Place rectally.     bisacodyl (DULCOLAX) 10 MG suppository Place rectally.     bismuth subsalicylate (PEPTO BISMOL) 262 MG/15ML suspension Take by mouth.     botulinum toxin Type A (BOTOX) 100 units SOLR injection Inject into the skin.     Botulinum Toxin Type A, Cosm, 100 units SOLR Inject into the skin.     calcium carbonate (TUMS EX) 750 MG chewable tablet Chew by mouth.     clindamycin (CLEOCIN T) 1 % lotion Apply topically.     cyclobenzaprine (FLEXERIL) 10 MG tablet      dantrolene (DANTRIUM) 100 MG capsule Take 1 capsule (100 mg total) by mouth 3 (three) times daily. 270 capsule  3   diazepam (VALIUM) 5 MG tablet TAKE 1 TABLET BY MOUTH EVERY 12 HOURS AS NEEDED FOR MUSCLE SPASMS 60 tablet 5   diphenhydrAMINE (SOMINEX) 25 MG tablet Take by mouth.     Docusate Sodium (DSS) 100 MG CAPS Take by mouth.     guaiFENesin (MUCINEX) 600 MG 12 hr tablet Take by mouth.     loperamide (IMODIUM A-D) 2 MG tablet Take by mouth.     loperamide (IMODIUM A-D) 2 MG tablet Take by mouth.     Magnesium Bisglycinate (MAG GLYCINATE) 100 MG TABS Take 2 tablets by mouth at bedtime.     methenamine (HIPREX) 1 g tablet Take by mouth.     miconazole nitrate (MONISTAT) 200 & 2 MG-% (9GM) KIT Place  vaginally.     mirtazapine (REMERON) 30 MG tablet Take 30 mg by mouth at bedtime.     NEXIUM 40 MG capsule Take 40 mg by mouth daily.      nystatin (MYCOSTATIN/NYSTOP) powder Apply to skinfolds prone to moisture (groin, under the breasts, abdomen)     omeprazole (PRILOSEC) 20 MG capsule Take by mouth.     ondansetron (ZOFRAN-ODT) 8 MG disintegrating tablet Take by mouth.     oxyCODONE (ROXICODONE) 5 MG immediate release tablet Take 1 tablet (5 mg total) by mouth every 6 (six) hours as needed. To replace percocet- since having difficulties with tylenol 120 tablet 0   phentermine (ADIPEX-P) 37.5 MG tablet Take 37.5 mg by mouth daily before breakfast.     phytonadione (VITAMIN K) 5 MG tablet Take by mouth.     polycarbophil (FIBERCON) 625 MG tablet Take 625 mg by mouth daily.     polyethylene glycol powder (GLYCOLAX/MIRALAX) 17 GM/SCOOP powder Take 3 teaspoonsfuls daily for constipation     potassium chloride SA (K-DUR) 20 MEQ tablet Take by mouth.      Probiotic Product (PROBIOTIC BLEND PO) Take 1 capsule by mouth daily.     promethazine (PHENERGAN) 25 MG tablet Take 0.5-1 tablets (12.5-25 mg total) by mouth every 6 (six) hours as needed for nausea or vomiting (take instead of zofran regularly). 120 tablet 1   pseudoephedrine (SUDAFED) 120 MG 12 hr tablet Take by mouth.     sodium chloride irrigation 0.9 % irrigation Irrigate with as directed.     Sodium Chloride, GU Irrigant, (0.9 % SODIUM CHLORIDE, POUR BTL, OPTIME) Irrigate with 1,000 mLs as directed daily. 10000 mL 11   SUMAtriptan 6 MG/0.5ML SOAJ Inject into the skin.     tiZANidine (ZANAFLEX) 4 MG tablet Take 1 tablet (4 mg total) by mouth every 6 (six) hours as needed for muscle spasms. 180 tablet 1   TRINTELLIX 5 MG TABS tablet Take 15 mg by mouth daily.     UBRELVY 50 MG TABS Take by mouth.     Ubrogepant 100 MG TABS Take one at onset of severe headache; may repeat once in 2 hours if headache persists; max of 2 in 24 hours      vortioxetine HBr (TRINTELLIX) 20 MG TABS tablet Take 20 mg by mouth 1 day or 1 dose.     warfarin (COUMADIN) 1 MG tablet Take by mouth.     warfarin (COUMADIN) 2 MG tablet Take by mouth.     warfarin (COUMADIN) 4 MG tablet Take by mouth.     warfarin (COUMADIN) 6 MG tablet Take by mouth.     No current facility-administered medications for this visit.      Objective:  There were no vitals filed for this visit.    There is no height or weight on file to calculate BMI.    Physical Exam:     General: Well-appearing, cooperative, sitting comfortably in no acute distress.   OMT Physical Exam:  ASIS Compression Test: Positive Right Cervical: TTP paraspinal, *** Rib: Bilateral elevated first rib with TTP Thoracic: TTP paraspinal,*** Lumbar: TTP paraspinal,*** Pelvis: Right anterior innominate  Electronically signed by:  Aleen Sells [name] 7:25 AM 08/10/23

## 2023-08-11 ENCOUNTER — Ambulatory Visit: Payer: Medicare PPO | Admitting: Sports Medicine

## 2023-08-24 NOTE — Progress Notes (Deleted)
Aleen Sells [name] [address] [place] [address] Phone: [phone]   Assessment and Plan:     There are no diagnoses linked to this encounter.  *** - Patient has received relief with OMT in the past.  Elects for repeat OMT today.  Tolerated well per note below. - Decision today to treat with OMT was based on Physical Exam   After verbal consent patient was treated with HVLA (high velocity low amplitude), ME (muscle energy), FPR (flex positional release), ST (soft tissue), PC/PD (Pelvic Compression/ Pelvic Decompression) techniques in cervical, rib, thoracic, lumbar, and pelvic areas. Patient tolerated the procedure well with improvement in symptoms.  Patient educated on potential side effects of soreness and recommended to rest, hydrate, and use Tylenol as needed for pain control.   Pertinent previous records reviewed include ***   Follow Up: ***     Subjective:   I, Kristy Schomburg, am serving as a Neurosurgeon for Doctor Richardean Sale   Chief Complaint: neck pain    HPI:    03/17/2023 Patient is a 52 year old female complaining of neck pain. Patient states that she has neck pain off and on for years, pain at the base of the skull and down to her neck and shoulders,  pain is affecting her sinus , migraines, nausea. she is worried there is an alignment issue in her neck , decreased ROM ,constant pain , she does get botox for migraines , she is having some spasticity as well , trigger point didn't help    04/12/2023 Patient states neck pain isnt bad , she is getting over a cold for a month   05/17/2023 Patient states that she has had several migraines back to back    06/14/2023 Patient states she has been doing pretty well , is having reaction to phentermine with spasticity and muscle spasm in the neck area   07/12/2023 Patient states she has had some stomach pain that has caused a flare throughout the neck and back pain    08/25/2023 Patient  states    Relevant Historical Information: T7 SCI with paraplegia, autonomic dysfunction, wheelchair dependent, on anticoagulation chronically, status post left nephrectomy    Additional pertinent review of systems negative.  Current Outpatient Medications  Medication Sig Dispense Refill   adapalene (DIFFERIN) 0.1 % gel Apply topically daily.     adapalene (DIFFERIN) 0.1 % gel Apply topically.     Amino Acids-Protein Hydrolys (PRO-STAT 101 PO) Take by mouth.     Aspirin-Caffeine 1000-65 MG PACK Take by mouth.     baclofen (LIORESAL) 20 MG tablet Take 2 tablets (40 mg total) by mouth 3 (three) times daily. 540 tablet 3   bisacodyl (DULCOLAX) 10 MG suppository Place rectally.     bisacodyl (DULCOLAX) 10 MG suppository Place rectally.     bisacodyl (DULCOLAX) 10 MG suppository Place rectally.     bismuth subsalicylate (PEPTO BISMOL) 262 MG/15ML suspension Take by mouth.     botulinum toxin Type A (BOTOX) 100 units SOLR injection Inject into the skin.     Botulinum Toxin Type A, Cosm, 100 units SOLR Inject into the skin.     calcium carbonate (TUMS EX) 750 MG chewable tablet Chew by mouth.     clindamycin (CLEOCIN T) 1 % lotion Apply topically.     cyclobenzaprine (FLEXERIL) 10 MG tablet      dantrolene (DANTRIUM) 100 MG capsule Take 1 capsule (100 mg total) by mouth 3 (three) times  daily. 270 capsule 3   diazepam (VALIUM) 5 MG tablet TAKE 1 TABLET BY MOUTH EVERY 12 HOURS AS NEEDED FOR MUSCLE SPASMS 60 tablet 5   diphenhydrAMINE (SOMINEX) 25 MG tablet Take by mouth.     Docusate Sodium (DSS) 100 MG CAPS Take by mouth.     guaiFENesin (MUCINEX) 600 MG 12 hr tablet Take by mouth.     loperamide (IMODIUM A-D) 2 MG tablet Take by mouth.     loperamide (IMODIUM A-D) 2 MG tablet Take by mouth.     Magnesium Bisglycinate (MAG GLYCINATE) 100 MG TABS Take 2 tablets by mouth at bedtime.     methenamine (HIPREX) 1 g tablet Take by mouth.     miconazole nitrate (MONISTAT) 200 & 2 MG-% (9GM) KIT Place  vaginally.     mirtazapine (REMERON) 30 MG tablet Take 30 mg by mouth at bedtime.     NEXIUM 40 MG capsule Take 40 mg by mouth daily.      nystatin (MYCOSTATIN/NYSTOP) powder Apply to skinfolds prone to moisture (groin, under the breasts, abdomen)     omeprazole (PRILOSEC) 20 MG capsule Take by mouth.     ondansetron (ZOFRAN-ODT) 8 MG disintegrating tablet Take by mouth.     oxyCODONE (ROXICODONE) 5 MG immediate release tablet Take 1 tablet (5 mg total) by mouth every 6 (six) hours as needed. To replace percocet- since having difficulties with tylenol 120 tablet 0   phentermine (ADIPEX-P) 37.5 MG tablet Take 37.5 mg by mouth daily before breakfast.     phytonadione (VITAMIN K) 5 MG tablet Take by mouth.     polycarbophil (FIBERCON) 625 MG tablet Take 625 mg by mouth daily.     polyethylene glycol powder (GLYCOLAX/MIRALAX) 17 GM/SCOOP powder Take 3 teaspoonsfuls daily for constipation     potassium chloride SA (K-DUR) 20 MEQ tablet Take by mouth.      Probiotic Product (PROBIOTIC BLEND PO) Take 1 capsule by mouth daily.     promethazine (PHENERGAN) 25 MG tablet Take 0.5-1 tablets (12.5-25 mg total) by mouth every 6 (six) hours as needed for nausea or vomiting (take instead of zofran regularly). 120 tablet 1   pseudoephedrine (SUDAFED) 120 MG 12 hr tablet Take by mouth.     sodium chloride irrigation 0.9 % irrigation Irrigate with as directed.     Sodium Chloride, GU Irrigant, (0.9 % SODIUM CHLORIDE, POUR BTL, OPTIME) Irrigate with 1,000 mLs as directed daily. 10000 mL 11   SUMAtriptan 6 MG/0.5ML SOAJ Inject into the skin.     tiZANidine (ZANAFLEX) 4 MG tablet Take 1 tablet (4 mg total) by mouth every 6 (six) hours as needed for muscle spasms. 180 tablet 1   TRINTELLIX 5 MG TABS tablet Take 15 mg by mouth daily.     UBRELVY 50 MG TABS Take by mouth.     Ubrogepant 100 MG TABS Take one at onset of severe headache; may repeat once in 2 hours if headache persists; max of 2 in 24 hours      vortioxetine HBr (TRINTELLIX) 20 MG TABS tablet Take 20 mg by mouth 1 day or 1 dose.     warfarin (COUMADIN) 1 MG tablet Take by mouth.     warfarin (COUMADIN) 2 MG tablet Take by mouth.     warfarin (COUMADIN) 4 MG tablet Take by mouth.     warfarin (COUMADIN) 6 MG tablet Take by mouth.     No current facility-administered medications for this visit.  Objective:     There were no vitals filed for this visit.    There is no height or weight on file to calculate BMI.    Physical Exam:     General: Well-appearing, cooperative, sitting comfortably in no acute distress.   OMT Physical Exam:  ASIS Compression Test: Positive Right Cervical: TTP paraspinal, *** Rib: Bilateral elevated first rib with TTP Thoracic: TTP paraspinal,*** Lumbar: TTP paraspinal,*** Pelvis: Right anterior innominate  Electronically signed by:  Aleen [facility]ells [name] 12:14 PM 08/24/23

## 2023-08-25 ENCOUNTER — Ambulatory Visit: Payer: Medicare PPO | Admitting: Sports Medicine

## 2023-10-03 ENCOUNTER — Encounter: Payer: Self-pay | Admitting: Physical Medicine and Rehabilitation

## 2023-10-05 MED ORDER — DIAZEPAM 5 MG PO TABS: 5.0000 mg | ORAL_TABLET | Freq: Two times a day (BID) | ORAL | 5 refills | Status: DC | PRN

## 2023-10-05 MED ORDER — OXYCODONE HCL 5 MG PO TABS: 5.0000 mg | ORAL_TABLET | Freq: Four times a day (QID) | ORAL | 0 refills | Status: DC | PRN

## 2023-10-21 ENCOUNTER — Encounter: Payer: Self-pay | Admitting: Physical Medicine and Rehabilitation

## 2023-10-21 ENCOUNTER — Encounter: Payer: Medicare PPO | Attending: Physical Medicine and Rehabilitation | Admitting: Physical Medicine and Rehabilitation

## 2023-10-21 VITALS — BP 130/80 | Ht 70.0 in | Wt 330.0 lb

## 2023-10-21 DIAGNOSIS — G8221 Paraplegia, complete: Secondary | ICD-10-CM | POA: Diagnosis present

## 2023-10-21 DIAGNOSIS — N390 Urinary tract infection, site not specified: Secondary | ICD-10-CM | POA: Insufficient documentation

## 2023-10-21 DIAGNOSIS — Z993 Dependence on wheelchair: Secondary | ICD-10-CM | POA: Insufficient documentation

## 2023-10-21 DIAGNOSIS — G894 Chronic pain syndrome: Secondary | ICD-10-CM | POA: Insufficient documentation

## 2023-10-21 MED ORDER — DANTROLENE SODIUM 100 MG PO CAPS: 100.0000 mg | ORAL_CAPSULE | Freq: Three times a day (TID) | ORAL | 3 refills | Status: DC

## 2023-10-21 NOTE — Progress Notes (Signed)
Subjective:    Patient ID: Rachael Turner, female    DOB: October 30, 1971, 52 y.o.   MRN: [id number]  HPI  An audio/video tele-health visit is felt to be the most appropriate encounter for this patient at this time. This is a follow up tele-visit via phone. The patient is at home. MD is at office. Prior to scheduling this appointment, our staff discussed the limitations of evaluation and management by telemedicine and the availability of in-person appointments. The patient expressed understanding and agreed to proceed.    Pt is a 52 yr old female with T7 paraplegia, C Diff (+), frequent UTIs, autonomic dysfunction, Neurogenic bowel and bladder, spasticity and ileoconduit for bladder, multiple Stage IV pressure ulcers and heel pressure ulcers. Here for f/u.  For f/u on paraplegia and spasticity. Still has chronic osteomyelitis. Also has IBS. 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    Has to research with insurance- want her to stop seeing her PCP of years- out of network  as of January 1st- and seeing if will be a big issue? Doesn't want to change, since has a great doc.    ID doctor- very unhappy at [facility] with current doctor- and didn't seem to understand she lost a kidney already to UTI's.    Dantrolene- last time got it filled- gave her 90 pills-    Doing pretty good otherwise.   Problems with [facility] at [facility]- wants better leg pads-  and  wedge for  Face to face- but hasn't heard back from him-  No fresh wounds in last 2 weeks- cannot get enough pressure relief heels-  Kathee Delton    Pain Inventory Average Pain 7 Pain Right Now 4 My pain is intermittent and aching  LOCATION OF PAIN  head and neck  BOWEL Number of stools per week: 3 Enema or suppository use Yes    BLADDER In and out cath, frequency q4 hrs Able to self cath Yes   Mobility use a wheelchair needs help with transfers  Function disabled: date disabled . I need  assistance with the following:  dressing, bathing, toileting, meal prep, household duties, and shopping  Neuro/Psych bladder control problems bowel control problems spasms depression  Prior Studies Any changes since last visit?  no  Physicians involved in your care Any changes since last visit?  no   No family history on file. Social History   Socioeconomic History   Marital status: Married    Spouse name: Not on file   Number of children: Not on file   Years of education: Not on file   Highest education level: Not on file  Occupational History   Not on file  Tobacco Use   Smoking status: Never   Smokeless tobacco: Never  Vaping Use   Vaping status: Never Used  Substance and Sexual Activity   Alcohol use: Not Currently    Comment: rare   Drug use: Never   Sexual activity: Yes    Birth control/protection: Surgical  Other Topics Concern   Not on file  Social History Narrative   Not on file   Social Determinants of Health   Financial Resource Strain: Not on file  Food Insecurity: No Food Insecurity (09/29/2022)   [facility]26[facility] visits prior to 02/26/2023.   Hunger Vital Sign    Worried About Running Out of Food  in the Last Year: Never true    Ran Out of Food in the Last Year: Never true  Transportation Needs: No Transportation Needs (09/29/2022)   [facility] visits prior to 02/26/2023[facility] visits prior to 02/26/2023[facility]   PRAPARE - Transportation    Lack of Transportation (Non-Medical): No    Lack of Transportation (Medical): No  Physical Activity: Not on file  Stress: Not on file  Social Connections: Unknown (05/07/2022)   Received from [facility] [place], [facility]   Social Network    Social Network: Not on file   No past surgical history on  file. Past Medical History:  Diagnosis Date   C. difficile diarrhea 07/2020   Complication of anesthesia    Difficult intubation    BP 130/80 Comment: last taken  Ht 5\' 10"  (1.778 m)   Wt (!) 330 lb (149.7 kg) Comment: reported  BMI 47.35 kg/m   Opioid Risk Score:   Fall Risk Score:  `1  Depression screen PHQ 2/9     10/21/2023    2:17 PM 07/18/2023    2:53 PM 01/17/2023    2:44 PM 05/21/2022    1:17 PM 02/12/2022    1:47 PM 03/20/2021    1:11 PM 11/07/2020    1:46 PM  Depression screen PHQ 2/9  Decreased Interest 1 1 0 1 0 1 1  Down, Depressed, Hopeless 1 1 0 1 0 1 1  PHQ - 2 Score 2 2 0 2 0 2 2     Review of Systems  Constitutional: Negative.   HENT: Negative.    Eyes: Negative.   Respiratory: Negative.    Cardiovascular: Negative.   Gastrointestinal:        Bowel program  Endocrine: Negative.   Genitourinary:        In and out cath  Musculoskeletal:  Positive for neck pain.       Spasms  Skin: Negative.   Allergic/Immunologic: Negative.   Neurological:  Positive for headaches.  Hematological:  Bruises/bleeds easily.       Warfarin  Psychiatric/Behavioral:  Positive for dysphoric mood.   All other systems reviewed and are negative.      Objective:   Physical Exam        Assessment & Plan:   Pt is a 52 yr old female with T7 paraplegia, C Diff (+), frequent UTIs, autonomic dysfunction, Neurogenic bowel and bladder, spasticity and ileoconduit for bladder, multiple Stage IV pressure ulcers and heel pressure ulcers. Here for f/u.  For f/u on paraplegia and spasticity. Still has chronic osteomyelitis. Also has IBS. 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    Difficulty with new ID doctor- esp because has to change every 2 years at [facility]-- suggest if can get patient switch and  come to - instead of going to [facility]- Dr Judyann Munson is my choice, but will see if she's accepting new patients- -  currently  dealing with UTI- should/usually gets outpt PICC line- and give IV ABX- failed fosfomycin  and Augmentin tears up stomach severely.  B. She's lost a kidney due to complex UTIs in past-   2. Will send in Dantrolene 100 mg TID- # 270- 3 months with 3 refills. Only got 90 pills- 30 days supply and needs to get again- but pharmacy said not due- asked her to take bottle down to pharmacy-  3. Last refilled Oxycodone beginning of October 2024- doesn't need more refills today.    4. D/w Kathee Delton- about leg pads/braces and wedge under cushion to improve foot ulcers. Sent him message to ask about these issues.    5.  SCI support group- we discussed it again-  We might meet 11/21 in November-    6. F/U - f/u in 3 months- double appt.    I spent a total of  24  minutes on total care today- >50% coordination of care- due to left message for Dr Drue Second, l/m for Ben [facility] at [facility] about pt's w/c- d/w pt about SCI support group, pain meds and dantrolene-

## 2024-01-16 ENCOUNTER — Encounter: Payer: Self-pay | Admitting: Physical Medicine and Rehabilitation

## 2024-01-16 ENCOUNTER — Encounter: Payer: Medicare PPO | Attending: Physical Medicine and Rehabilitation | Admitting: Physical Medicine and Rehabilitation

## 2024-01-16 VITALS — BP 142/86 | HR 8 | Ht 70.0 in | Wt 302.0 lb

## 2024-01-16 DIAGNOSIS — Z79891 Long term (current) use of opiate analgesic: Secondary | ICD-10-CM | POA: Diagnosis not present

## 2024-01-16 DIAGNOSIS — Z5181 Encounter for therapeutic drug level monitoring: Secondary | ICD-10-CM | POA: Insufficient documentation

## 2024-01-16 DIAGNOSIS — R252 Cramp and spasm: Secondary | ICD-10-CM | POA: Diagnosis not present

## 2024-01-16 DIAGNOSIS — G894 Chronic pain syndrome: Secondary | ICD-10-CM | POA: Diagnosis present

## 2024-01-16 DIAGNOSIS — G8921 Chronic pain due to trauma: Secondary | ICD-10-CM | POA: Diagnosis present

## 2024-01-16 DIAGNOSIS — G8221 Paraplegia, complete: Secondary | ICD-10-CM | POA: Insufficient documentation

## 2024-01-16 DIAGNOSIS — G43119 Migraine with aura, intractable, without status migrainosus: Secondary | ICD-10-CM | POA: Insufficient documentation

## 2024-01-16 MED ORDER — METOCLOPRAMIDE HCL 5 MG PO TABS
5.0000 mg | ORAL_TABLET | Freq: Three times a day (TID) | ORAL | 1 refills | Status: AC | PRN
Start: 2024-01-16 — End: ?

## 2024-01-16 MED ORDER — PREDNISONE 20 MG PO TABS: ORAL_TABLET | ORAL | 0 refills | Status: DC

## 2024-01-16 MED ORDER — BACLOFEN 20 MG PO TABS: 40.0000 mg | ORAL_TABLET | Freq: Three times a day (TID) | ORAL | 3 refills | Status: DC

## 2024-01-16 NOTE — Progress Notes (Signed)
Subjective:    Patient ID: Rachael Turner, female    DOB: 07/16/1971, 53 y.o.   MRN: [id number]  HPI  Pt is a 53 yr old female with T7 paraplegia since 2008- , C Diff (+), frequent UTIs, autonomic dysfunction, Neurogenic bowel and bladder, spasticity and ileoconduit for bladder, multiple Stage IV pressure ulcers and heel pressure ulcers. Here for f/u.  For f/u on paraplegia and spasticity. Still has chronic osteomyelitis. Also has IBS. 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   Every time takes Imitrex shot, Sx's go away and nausea. But comes right back. Took 2 shots today.   Almost didn't make it to appt today.   Started Thursday or Friday- yesterday was  better initially, and cleared up more- but this AM, worse again.   Last Botox 12/15/23.   Has Hybrid elite ROHO   Is able to keep Primary care doctor for the moment- but got letter from insurance, cannot keep PCP.    Finally got wedge for cushion-  Had healed up the wounds on feet, but got new boots and within a few days, she had new wounds on feet.  Lowered foot plates and seeing wound care 2x/week.  Didn't get better leg pads for w/c for feet.     For nausea- zofran working better than phenergan.  But won't go away with those meds, except with Imitrex shots Had Nurtec- won't touch it- and Ubrelvy wouldn't touch it either.  Only Imitrex.  Will make it go away 4-5 hours at most.   Has felt that is getting twisted in chair- and leaning to right- even her CNA agrees with this.     Pain Inventory Average Pain 4 Pain Right Now 5 My pain is intermittent, dull, tingling, and aching  In the last 24 hours, has pain interfered with the following? General activity 5 Relation with others 5 Enjoyment of life 5 What TIME of day is your pain at its worst?  varies Sleep (in general) Fair  Pain is worse with: unsure Pain improves with: medication Relief from Meds: 6  No family history on file. Social History   Socioeconomic History   Marital status: Married    Spouse name: Not on file   Number of children: Not on file   Years of education: Not on file   Highest education level: Not on file  Occupational History   Not on file  Tobacco Use   Smoking status: Never   Smokeless tobacco: Never  Vaping Use   Vaping status: Never Used  Substance and Sexual Activity   Alcohol use: Not Currently    Comment: rare   Drug use: Never   Sexual activity: Yes    Birth control/protection: Surgical  Other Topics Concern   Not on file  Social History Narrative   Not on file   Social Drivers of Health   Financial Resource Strain: Not on file  Food Insecurity: No Food Insecurity (09/29/2022)   [facility] prior to 02/26/2023.   Hunger Vital Sign    Worried About Running Out of Food in the Last Year: Never true    Ran Out of Food in the Last Year: Never true  Transportation Needs: No Transportation Needs (  09/29/2022)   [facility] to [facility] prior to [facility]/26/2023[facility]   PRAPARE - Transportation    Lack of Transportation (Non-Medical): No    Lack of Transportation (Medical): No  Physical Activity: Not on file  Stress: Not on file  Social Connections: Unknown (05/07/2022)   Received from [facility]   Social Network    Social Network: Not on file   No past surgical history on file. No past surgical history on file. Past Medical History:  Diagnosis Date   C. difficile diarrhea 07/2020   Complication of anesthesia    Difficult intubation    BP (!) 142/86   Pulse (!) 8   Ht 5'  10" (1.778 m)   Wt (!) 302 lb (137 kg)   SpO2 97%   BMI 43.33 kg/m   Opioid Risk Score:   Fall Risk Score:  `1  Depression screen PHQ 2/9     10/21/2023    2:17 PM 07/18/2023    2:53 PM 01/17/2023    2:44 PM 05/21/2022    1:17 PM 02/12/2022    1:47 PM 03/20/2021    1:11 PM 11/07/2020    1:46 PM  Depression screen PHQ 2/9  Decreased Interest 1 1 0 1 0 1 1  Down, Depressed, Hopeless 1 1 0 1 0 1 1  PHQ - 2 Score 2 2 0 2 0 2 2     Review of Systems  Musculoskeletal:        Spinal cord  Neurological:  Positive for headaches.  All other systems reviewed and are negative.     Objective:   Physical Exam  Awake, alert, but obviously doing poorly, in power w/c;  Squinting a lot- turned off light- then closed eyes for most of appointment Has compression stocking on B/L feet- to below knees B/L And legs tied at distal thigh B/L  Wearing PREVALON boots B/L  L hip popped out to L side and R shoulder is leaning to R more than expected- the posture changes occurs pretty fast- within 1 minute. Appears to be from scoliosis- with S curve mid thoracic.   Also has significant R head tilt.  R shoulder also sitting HIGHER than than L as well     Assessment & Plan:   Pt is a 53 yr old female with T7 paraplegia, C Diff (+), frequent UTIs, autonomic dysfunction, Neurogenic bowel and bladder, spasticity and ileoconduit for bladder, multiple Stage IV pressure ulcers and heel pressure ulcers. Here for f/u.  For f/u on paraplegia and spasticity. Still has chronic osteomyelitis. Also has IBS. 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  Not diabetic, so will try Prednisone- 40 mg x 2 days, then 20 mg x 2 days, then 10 mg x 2 days, then stop- to try and stop cycle of migraines.    3. Will give Reglan 5-10 mg as needed  up to 3x/day- to help nausea/vomiting.    4. If this doesn't work, call Neuro for occipital  nerve blcoks bilaterally.    5. The tension in back of head is TRIGGERING her migraines, not a migraine itself.    6.  They /Neuro need to fix cervical dystonia- with Botox- since had significant head tilt to right- chin to Left.  7. - due to scoliosis, really needs a ROHO quadtro- - Ask Romeo Apple if can get her a Babs Sciara  to help balance her in w/c. Left message for [facility]- and gave Rx to pt.   8. Oral drug screen.  Due per clinic guidelines- takes rarely.    9. Con't Dantrolene; and Baclofen- sent in refill of Baclofen- has dantrolene  10. Con't Valium for spasticity  11. Con't Oxycodone- allergic to tylenol.    12. F/U in 3 months- double appt- [facility]  13. Will check CMP- if hasn't been in checked in the last 5 months- if it has, then we will wait- was done 12/12- were normal    I spent a total of    minutes on total care today- >50% coordination of care- due to

## 2024-01-16 NOTE — Patient Instructions (Signed)
Pt is a 53 yr old female with T7 paraplegia, C Diff (+), frequent UTIs, autonomic dysfunction, Neurogenic bowel and bladder, spasticity and ileoconduit for bladder, multiple Stage IV pressure ulcers and heel pressure ulcers. Here for f/u.  For f/u on paraplegia and spasticity. Still has chronic osteomyelitis. Also has IBS. 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  Not diabetic, so will try Prednisone- 40 mg x 2 days, then 20 mg x 2 days, then 10 mg x 2 days, then stop- to try and stop cycle of migraines.    3. Will give Reglan 5-10 mg as needed  up to 3x/day- to help nausea/vomiting.    4. If this doesn't work, call Neuro for occipital nerve blcoks bilaterally.    5. The tension in back of head is TRIGGERING her migraines, not a migraine itself.    6.  They /Neuro need to fix cervical dystonia- with Botox- since had significant head tilt to right- chin to Left.    7. - due to scoliosis, really needs a ROHO quadtro- - Ask Romeo Apple if can get her a Babs Sciara  to help balance her in w/c. Left message for [facility]- and gave Rx to pt.   8. Oral drug screen.  Due per clinic guidelines- takes rarely.    9. Con't Dantrolene; and Baclofen- sent in refill of Baclofen- has dantrolene  10. Con't Valium for spasticity  11. Con't Oxycodone- allergic to tylenol.    12. F/U in 3 months- double appt- [facility]  13. Will check CMP- if hasn't been in checked in the last 5 months- if it has, then we will wait- was done 12/12- were normal

## 2024-01-16 NOTE — Addendum Note (Signed)
Addended by: Silas Sacramento T on: 01/16/2024 02:40 PM   Modules accepted: Orders

## 2024-01-20 LAB — DRUG TOX MONITOR 1 W/CONF, ORAL FLD
Amphetamines: NEGATIVE ng/mL (ref <10)
Barbiturates: NEGATIVE ng/mL (ref <10)
Benzodiazepines: NEGATIVE ng/mL (ref <0.50)
Buprenorphine: NEGATIVE ng/mL (ref <0.10)
Cocaine: NEGATIVE ng/mL (ref <5.0)
Codeine: NEGATIVE ng/mL (ref <2.5)
Dihydrocodeine: NEGATIVE ng/mL (ref <2.5)
Fentanyl: NEGATIVE ng/mL (ref <0.10)
Heroin Metabolite: NEGATIVE ng/mL (ref <1.0)
Hydrocodone: NEGATIVE ng/mL (ref <2.5)
Hydromorphone: NEGATIVE ng/mL (ref <2.5)
MARIJUANA: NEGATIVE ng/mL (ref <2.5)
MDMA: NEGATIVE ng/mL (ref <10)
Meprobamate: NEGATIVE ng/mL (ref <2.5)
Methadone: NEGATIVE ng/mL (ref <5.0)
Morphine: NEGATIVE ng/mL (ref <2.5)
Nicotine Metabolite: NEGATIVE ng/mL (ref <5.0)
Norhydrocodone: NEGATIVE ng/mL (ref <2.5)
Noroxycodone: NEGATIVE ng/mL (ref <2.5)
Opiates: POSITIVE ng/mL — AB (ref <2.5)
Oxycodone: 2.7 ng/mL — ABNORMAL HIGH (ref <2.5)
Oxymorphone: NEGATIVE ng/mL (ref <2.5)
Phencyclidine: NEGATIVE ng/mL (ref <10)
Tapentadol: NEGATIVE ng/mL (ref <5.0)
Tramadol: NEGATIVE ng/mL (ref <5.0)
Zolpidem: NEGATIVE ng/mL (ref <5.0)

## 2024-01-20 LAB — DRUG TOX ALC METAB W/CON, ORAL FLD: Alcohol Metabolite: NEGATIVE ng/mL (ref <25)

## 2024-02-21 ENCOUNTER — Ambulatory Visit: Payer: Medicare PPO | Admitting: Internal Medicine

## 2024-02-21 ENCOUNTER — Other Ambulatory Visit: Payer: Self-pay

## 2024-02-21 DIAGNOSIS — N390 Urinary tract infection, site not specified: Secondary | ICD-10-CM

## 2024-02-21 DIAGNOSIS — N319 Neuromuscular dysfunction of bladder, unspecified: Secondary | ICD-10-CM

## 2024-02-21 MED ORDER — AMOXICILLIN 500 MG PO CAPS: 1000.0000 mg | ORAL_CAPSULE | Freq: Three times a day (TID) | ORAL | 0 refills | Status: DC

## 2024-02-21 NOTE — Patient Instructions (Signed)
 Take amox 1gm TID x 7 days, followed by amox 500mg  daily x 3 wk   We will see you back on televisit in 7 days to see how you are doing  If having nausea, try to premedicate with zofran or phenergan. If that is still not working, then call us to discuss decreasing dose to 500mg  tid

## 2024-02-21 NOTE — Progress Notes (Unsigned)
 RFV: recurrent uti  Patient ID: Rachael Turner, female   DOB: 12/12/1971, 53 y.o.   MRN: [id number]  HPI Rachael Turner is referred to [place] by dr lovorn. Rachael Turner is a 53yo F with  Pt is a 53 yr old female with T7 paraplegia since 2008- , C Diff (+), frequent UTIs, autonomic dysfunction, Neurogenic bowel and bladder, spasticity and ileoconduit for bladder, multiple Stage IV pressure ulcers and heel pressure ulcers. Here for f/u.  For f/u on paraplegia and spasticity. Still has chronic osteomyelitis. Also has IBS. 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   adapalene (DIFFERIN) 0.1 % gel Apply topically.   Aspirin-Caffeine 1000-65 MG PACK Take by mouth.   baclofen (LIORESAL) 20 MG tablet Take 2 tablets (40 mg total) by mouth 3 (three) times daily.   bisacodyl (DULCOLAX) 10 MG suppository Place rectally.   bisacodyl (DULCOLAX) 10 MG suppository Place rectally.   bisacodyl (DULCOLAX) 10 MG suppository Place rectally.   bismuth subsalicylate (PEPTO BISMOL) 262 MG/15ML suspension Take by mouth.   botulinum toxin Type A (BOTOX) 100 units SOLR injection Inject into the skin.   Botulinum Toxin Type A, Cosm, 100 units SOLR Inject into the skin.   calcium carbonate (TUMS EX) 750 MG chewable tablet Chew by mouth.   Cholecalciferol (VITAMIN D3) 50 MCG (2000 UT) TABS Take by mouth.   clindamycin (CLEOCIN T) 1 % lotion Apply topically.   cyclobenzaprine (FLEXERIL) 10 MG tablet    dantrolene (DANTRIUM) 100 MG capsule Take 1 capsule (100 mg total) by mouth 3 (three) times daily. Last Rx was given for 90 pills- not 270 pills-  - needs a new 90 day Rx to be filled- for spasticity- along with baclofen   diazepam (VALIUM) 5 MG tablet Take 1 tablet (5 mg total) by mouth every 12 (twelve) hours as  needed for muscle spasms.   diphenhydrAMINE (SOMINEX) 25 MG tablet Take by mouth.   Docusate Sodium (DSS) 100 MG CAPS Take by mouth.   guaiFENesin (MUCINEX) 600 MG 12 hr tablet Take by mouth.   loperamide (IMODIUM A-D) 2 MG tablet Take by mouth.   loperamide (IMODIUM A-D) 2 MG tablet Take by mouth.   Magnesium Bisglycinate (MAG GLYCINATE) 100 MG TABS Take 2 tablets by mouth at bedtime.   methenamine (HIPREX) 1 g tablet Take by mouth.   metoCLOPramide (REGLAN) 5 MG tablet Take 1-2 tablets (5-10 mg total) by mouth every 8 (eight) hours as needed for refractory nausea / vomiting.   miconazole nitrate (MONISTAT) 200 & 2 MG-% (9GM) KIT Place vaginally.   mirtazapine (REMERON) 30 MG tablet Take 30 mg by mouth at bedtime.   NEXIUM 40 MG capsule Take 40 mg by mouth daily.    nystatin (MYCOSTATIN/NYSTOP) powder Apply to skinfolds prone to moisture (groin, under the breasts, abdomen)   ondansetron (ZOFRAN-ODT) 8 MG disintegrating tablet Take by mouth.   oxyCODONE (ROXICODONE) 5 MG immediate release tablet Take 1 tablet (5 mg total) by mouth every 6 (six) hours as needed. To replace percocet- since having difficulties with tylenol   phytonadione (VITAMIN K) 5 MG tablet Take by mouth.   polyethylene glycol powder (GLYCOLAX/MIRALAX) 17 GM/SCOOP powder Take 3 teaspoonsfuls daily for constipation   Probiotic Product (PROBIOTIC BLEND PO) Take 1 capsule by mouth daily.  promethazine (PHENERGAN) 25 MG tablet Take 0.5-1 tablets (12.5-25 mg total) by mouth every 6 (six) hours as needed for nausea or vomiting (take instead of zofran regularly).   pseudoephedrine (SUDAFED) 120 MG 12 hr tablet Take by mouth.   sodium chloride irrigation 0.9 % irrigation Irrigate with as directed.   Sodium Chloride, GU Irrigant, (0.9 % SODIUM CHLORIDE, POUR BTL, OPTIME) Irrigate with 1,000 mLs as directed daily.   SUMAtriptan 6 MG/0.5ML SOAJ Inject into the skin.   tiZANidine (ZANAFLEX) 4 MG tablet Take 1 tablet (4 mg total) by  mouth every 6 (six) hours as needed for muscle spasms.   TRINTELLIX 5 MG TABS tablet Take 15 mg by mouth daily.   UBRELVY 50 MG TABS Take by mouth.   Ubrogepant 100 MG TABS Take one at onset of severe headache; may repeat once in 2 hours if headache persists; max of 2 in 24 hours   UNABLE TO FIND Med Name: iron repair heme plus   vortioxetine HBr (TRINTELLIX) 20 MG TABS tablet Take 20 mg by mouth 1 day or 1 dose.   warfarin (COUMADIN) 1 MG tablet Take by mouth.   warfarin (COUMADIN) 2 MG tablet Take by mouth.   warfarin (COUMADIN) 4 MG tablet Take by mouth.   warfarin (COUMADIN) 6 MG tablet Take by mouth.   Amino Acids-Protein Hydrolys (PRO-STAT 101 PO) Take by mouth. (Patient not taking: Reported on 02/21/2024)   omeprazole (PRILOSEC) 20 MG capsule Take by mouth. (Patient not taking: Reported on 02/21/2024)   phentermine (ADIPEX-P) 37.5 MG tablet Take 37.5 mg by mouth daily before breakfast.   polycarbophil (FIBERCON) 625 MG tablet Take 625 mg by mouth daily.   potassium chloride SA (K-DUR) 20 MEQ tablet Take by mouth.  (Patient not taking: Reported on 02/21/2024)   predniSONE (DELTASONE) 20 MG tablet 40 mg x 2 days, then 20 mg x 2 days then 10 mg x 2 days- then stop - to break a migraine streak   No facility-administered encounter medications on file as of 02/21/2024.     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    CBC   Assessment and Plan  Will do high dose amox 1gm TID x  7 days.  See back in 1 wk to assess how she tolerated it May need suppression x 3 wk to follow with amox 500mg  daily since she has had recurrence  since beginning of jan. Treated 2-3 courses including ceftriaxone Also colonised with cre PsA, efaecalis amp S

## 2024-03-01 ENCOUNTER — Other Ambulatory Visit: Payer: Self-pay

## 2024-03-01 ENCOUNTER — Telehealth (INDEPENDENT_AMBULATORY_CARE_PROVIDER_SITE_OTHER): Payer: Medicare PPO | Admitting: Internal Medicine

## 2024-03-01 DIAGNOSIS — N390 Urinary tract infection, site not specified: Secondary | ICD-10-CM

## 2024-03-01 DIAGNOSIS — N319 Neuromuscular dysfunction of bladder, unspecified: Secondary | ICD-10-CM

## 2024-03-01 NOTE — Progress Notes (Signed)
 Virtual Visit via Video Note  I connected with [name] on 03/01/24 at  1:00 PM EST by a video enabled telemedicine application and verified that I am speaking with the correct person using two identifiers.  Location: Patient: at home Provider: in clinic   I discussed the limitations of evaluation and management by telemedicine and the availability of in person appointments. The patient expressed understanding and agreed to proceed.  History of Present Illness: Rachael Turner is a 53 yo F with neurogenic bladder  doing well. Now on proph dose of amox 1 gm daily Observations/Objective: Appears well.  Assessment and Plan: If having recurrent symptoms, w will arrange for [facility] testing to do ua nad urine cx  Follow Up Instructions:   video visit in 4 wk  I discussed the assessment and treatment plan with the patient. The patient was provided an opportunity to ask questions and all were answered. The patient agreed with the plan and demonstrated an understanding of the instructions.   The patient was advised to call back or seek an in-person evaluation if the symptoms worsen or if the condition fails to improve as anticipated.  I provided 10 minutes of face-to-face time during this encounter.   Judyann Munson, MD

## 2024-03-15 ENCOUNTER — Other Ambulatory Visit: Payer: Self-pay | Admitting: Physical Medicine and Rehabilitation

## 2024-03-15 MED ORDER — OXYCODONE HCL 5 MG PO TABS: 5.0000 mg | ORAL_TABLET | Freq: Four times a day (QID) | ORAL | 0 refills | Status: DC | PRN

## 2024-03-29 ENCOUNTER — Telehealth: Admitting: Internal Medicine

## 2024-03-29 DIAGNOSIS — N319 Neuromuscular dysfunction of bladder, unspecified: Secondary | ICD-10-CM

## 2024-03-29 DIAGNOSIS — N390 Urinary tract infection, site not specified: Secondary | ICD-10-CM | POA: Diagnosis not present

## 2024-03-29 NOTE — Progress Notes (Signed)
 Virtual Visit via Video Note  I connected with Rachael Turner on 03/29/24 at  2:30 PM EDT by a video enabled telemedicine application and verified that I am speaking with the correct person using two identifiers.  Location: Patient:  at home Provider: at clinic   I discussed the limitations of evaluation and management by telemedicine and the availability of in person appointments. The patient expressed understanding and agreed to proceed.  History of Present Illness: Last couple weeks had nausea but no vomiting. Improving with pepto bismal and a few times of dry heaving. But not overt vomiting. Now off of abtx and back on hiprex.  Trying to figure out her oral hydration intake, -where she takes Q 3hrs in the day time. But stretches in the middle of night.   Observations/Objective:   Assessment and Plan: Continue with good oral hydration during the day Continue with hiprex    Follow Up Instructions: to do visit or televisit in 4 wk    I discussed the assessment and treatment plan with the patient. The patient was provided an opportunity to ask questions and all were answered. The patient agreed with the plan and demonstrated an understanding of the instructions.   The patient was advised to call back or seek an in-person evaluation if the symptoms worsen or if the condition fails to improve as anticipated.  I provided 15 minutes of-face-to-face time during this encounter.   Judyann Munson, MD

## 2024-04-09 ENCOUNTER — Ambulatory Visit: Payer: Medicare PPO | Admitting: Physical Medicine and Rehabilitation

## 2024-04-26 ENCOUNTER — Other Ambulatory Visit: Payer: Self-pay | Admitting: Physical Medicine and Rehabilitation

## 2024-05-11 ENCOUNTER — Other Ambulatory Visit: Payer: Self-pay

## 2024-05-11 MED ORDER — OXYCODONE HCL 5 MG PO TABS: 5.0000 mg | ORAL_TABLET | Freq: Four times a day (QID) | ORAL | 0 refills | Status: DC | PRN

## 2024-05-11 NOTE — Telephone Encounter (Signed)
 Need refill. Next appt is 5/30. Not enough meds to get to appt.

## 2024-05-25 ENCOUNTER — Encounter: Payer: Medicare PPO | Attending: Physical Medicine and Rehabilitation | Admitting: Physical Medicine and Rehabilitation

## 2024-05-25 ENCOUNTER — Encounter: Payer: Self-pay | Admitting: Physical Medicine and Rehabilitation

## 2024-05-25 VITALS — BP 122/82 | HR 70

## 2024-05-25 DIAGNOSIS — R252 Cramp and spasm: Secondary | ICD-10-CM | POA: Diagnosis present

## 2024-05-25 DIAGNOSIS — Z993 Dependence on wheelchair: Secondary | ICD-10-CM | POA: Diagnosis present

## 2024-05-25 DIAGNOSIS — G894 Chronic pain syndrome: Secondary | ICD-10-CM | POA: Diagnosis present

## 2024-05-25 DIAGNOSIS — G8221 Paraplegia, complete: Secondary | ICD-10-CM

## 2024-05-25 DIAGNOSIS — G43119 Migraine with aura, intractable, without status migrainosus: Secondary | ICD-10-CM

## 2024-05-25 DIAGNOSIS — N319 Neuromuscular dysfunction of bladder, unspecified: Secondary | ICD-10-CM

## 2024-05-25 MED ORDER — DANTROLENE SODIUM 100 MG PO CAPS: 100.0000 mg | ORAL_CAPSULE | Freq: Three times a day (TID) | ORAL | 3 refills | Status: DC

## 2024-05-25 MED ORDER — BACLOFEN 20 MG PO TABS: 40.0000 mg | ORAL_TABLET | Freq: Three times a day (TID) | ORAL | 3 refills | Status: AC

## 2024-05-25 MED ORDER — OXYCODONE HCL 5 MG PO TABS: 5.0000 mg | ORAL_TABLET | ORAL | 0 refills | Status: DC | PRN

## 2024-05-25 NOTE — Progress Notes (Signed)
 Subjective:    Patient ID: Rachael Turner, female    DOB: January 02, 1971, 54 y.o.   MRN: [id number]  HPI  Pt is a 53 yr old female with T7 paraplegia since 2008- , C Diff (+), frequent UTIs, autonomic dysfunction, Neurogenic bowel and bladder, spasticity and ileoconduit for bladder, multiple Stage IV pressure ulcers and heel pressure ulcers. Here for f/u.  For f/u on paraplegia and spasticity. Still has chronic osteomyelitis. Also has IBS. 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    Likes Dr Artemio Larry- ID- has seen her- night and day.  She decided to hit UTI hard and heavy and a low dose of ABX for 3 weeks after treatment.   Hard to get through with side effects- but has felt so much better since finished ABX-   Urine not pristine, but not nearly the problems had before.  Cathing q 3 hours- losing so much sleep- every time doesn't cath q3 hours- even 4 hours, is too much-  Several times per night- cathing.  Drinks 4L/day, no matter what.   But SO much fatigue- due to cathing or maybe something else.    Last Vit D 41- taking  a supplement-2000 international unit /day.     Last refill of Oxy 05/11/24 TSH 2.054-  Has been low on iron sometimes-Hb 14.9 l=this last check 02/21/24. PCP wanted her to start something- needs to restart anything.   HbA1c 5.6-   Needs help with [employer]- the guy that delivered didn't understand the Cushion- arrived 2 months ago.  Not using yet because it's set up right.   Uses [employer]-  Ben  Uses Pressure mapping-   Takes Oxycodone  more lately- will take 10 mg before drives. Back is hurting more and gets nauseated from pain.  Used to take a few days/week at most, but now taking 5-10 mg/day. Esp 10 mg when drives- hurts so much  Not sure they got Botox in right place for upper back - between shoulder blades- so hoping that's true, so will get better next time.   Stopped Ajovy to see if was helping or not.  Wondering if should restart or if it's something else?   Having migraine everything without actual HA- horrible dizziness/nausea and photophobia/ and sound sensitivity- but lack of actual HA- more frequent.   Still taking Valium -  varied frequency- several times per week- mainly for spasticity-   having neurogenic itching in back- Oxy helps that frequently- and valium  can help sometimes as well    R shoulder- xray showed no fx' bone spurs- - hand will get numb and tingling- by the time done with writing check, hand is numb and painful.   Occipital nerve block- first one this week- helped migraine down a little- but not sure if it's helped otherwise  Pain Inventory Average Pain 5 Pain Right Now 4 My pain is sharp, dull, stabbing, and tingling  In the last 24 hours, has pain interfered with the following? General activity 7 Relation with others 6 Enjoyment of life 7 What TIME of day is your pain at its worst? varies Sleep (in general) Fair  Pain is worse with: sitting Pain improves with: medication Relief from Meds: 7  No family history on file. Social History   Socioeconomic History   Marital status: Married    Spouse name: Not on file   Number of children: Not on file  Years of education: Not on file   Highest education level: Not on file  Occupational History   Not on file  Tobacco Use   Smoking status: Never   Smokeless tobacco: Never  Vaping Use   Vaping status: Never Used  Substance and Sexual Activity   Alcohol use: Not Currently    Comment: rare   Drug use: Never   Sexual activity: Yes    Birth control/protection: Surgical  Other Topics Concern   Not on file  Social History Narrative   Not on file   Social Drivers of Health   Financial Resource Strain: Not on file  Food Insecurity: Low Risk  (02/21/2024)   Received from [facility]   Hunger Vital Sign    Worried About Running Out of Food in the Last  Year: Never true    Ran Out of Food in the Last Year: Never true  Transportation Needs: No Transportation Needs (02/21/2024)   Received from [facility]    In the past 12 months, has lack of reliable transportation kept you from medical appointments, meetings, work or from getting things needed for daily living? : No  Physical Activity: Not on file  Stress: Not on file  Social Connections: Unknown (05/07/2022)   Received from [facility]   Social Network    Social Network: Not on file   No past surgical history on file. No past surgical history on file. Past Medical History:  Diagnosis Date   C. difficile diarrhea 07/2020   Complication of anesthesia    Difficult intubation    BP 122/82   Pulse 70   SpO2 99%   Opioid Risk Score:   Fall Risk Score:  `1  Depression screen PHQ 2/9     10/21/2023    2:17 PM 07/18/2023    2:53 PM 01/17/2023    2:44 PM 05/21/2022    1:17 PM 02/12/2022    1:47 PM 03/20/2021    1:11 PM 11/07/2020    1:46 PM  Depression screen PHQ 2/9  Decreased Interest 1 1 0 1 0 1 1  Down, Depressed, Hopeless 1 1 0 1 0 1 1  PHQ - 2 Score 2 2 0 2 0 2 2     Review of Systems  Musculoskeletal:  Positive for back pain and neck pain.       Right hand pain  All other systems reviewed and are negative.     Objective:   Physical Exam  Awake, alert, appropriate, very interactive, NAD In power w/c- joystick on R  No tinel's at wrist or elbows B/L Phalen's (-) Thenar eminence mild atrophy B/L  Empty can testing (+) for partial tear in RTC Biceps flexion (-) for increased pain No TTP at biceps origin anterior shoulder TTP at Posterior RTC/shoulder- on R shoulder Also TTP just below AC joint on upper humerus/upper deltoid-   MSK; 5/5 in Ue's B/L  0/5 in LE's-         Assessment & Plan:    Pt is a 53 yr old female with T7 paraplegia since 2008- , C Diff (+), frequent UTIs, autonomic dysfunction, Neurogenic bowel and  bladder, spasticity and ileoconduit for bladder, multiple Stage IV pressure ulcers and heel pressure ulcers. Here for f/u.  For f/u on paraplegia and spasticity. Still has chronic osteomyelitis. Also has IBS. 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   2. To address fatigue- hopefully the Vit D will help   3.  Called Ben from [employer]- to get [place]- correctly blown up pressure mapped for her buttocks pressure ulcer- and heels don't get pressure affected- uses wedges under front of of cushion to take pressure off heels-    4. Last Oxy Rx 05/11/24- gets 120/month, but will need to be seen q2 months if using Oxy more regularly.    5. Track barometric pressure- in the next 2 weeks- and see if needs to restart Ajovy- or just restart it.    6. We discussed that lack of Headache doesn't mean it's not a migraine- abdominal/vertigo migraines are a thing as well.    7. Likes Dr Artemio Larry- and sounds like has improved  her UTI's more.    8. Don't Take Valium  and Oxycodone  within 2 hours of each other- and theres' actually a black box warning- to try and avoid taking both at all. But will allow both Rx's, but try to avoid them at the same time.    9. Con't Valium - last refill 04/27/24  10. Con't Baclofen  40 mg  3x/day- for spasticity along with dantrolene - but can take like is taking 30 mg during day- to reduce sedation  11. Con't Dantrolene - 100 mg TID- refilled   12. Con't Oxycodone - last refill 2 weeks ago- will refill 1x today- and then refill in 6 weeks-    13. F/U in 2 months with Emilia Harbour for chronic pain and me in 4 months double appt- SCI   14. Oral drug screen- last Drug screen 01/16/24   I spent a total of 43    minutes on total care today- >50% coordination of care- due to spasticity, migraines spasticity meds And discussion about Oral drug screen- and called  [employer] rep about roho cushion as detailed above.

## 2024-05-25 NOTE — Patient Instructions (Signed)
 Pt is a 53 yr old female with T7 paraplegia since 2008- , C Diff (+), frequent UTIs, autonomic dysfunction, Neurogenic bowel and bladder, spasticity and ileoconduit for bladder, multiple Stage IV pressure ulcers and heel pressure ulcers. Here for f/u.  For f/u on paraplegia and spasticity. Still has chronic osteomyelitis. Also has IBS. 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   2. To address fatigue- hopefully the Vit D will help   3.  Called Ben from [employer]- to get [place]- correctly blown up pressure mapped for her buttocks pressure ulcer- and heels don't get pressure affected- uses wedges under front of of cushion to take pressure off heels-    4. Last Oxy Rx 05/11/24- gets 120/month, but will need to be seen q2 months if using Oxy more regularly.    5. Track barometric pressure- in the next 2 weeks- and see if needs to restart Ajovy- or just restart it.    6. We discussed that lack of Headache doesn't mean it's not a migraine- abdominal/vertigo migraines are a thing as well.    7. Likes Dr Artemio Larry- and sounds like has improved  her UTI's more.    8. Don't Take Valium  and Oxycodone  within 2 hours of each other- and theres' actually a black box warning- to try and avoid taking both at all. But will allow both Rx's, but try to avoid them at the same time.    9. Con't Valium - last refill 04/27/24  10. Con't Baclofen  40 mg  3x/day- for spasticity along with dantrolene - but can take like is taking 30 mg during day- to reduce sedation  11. Con't Dantrolene - 100 mg TID-    12. Con't Oxycodone - last refill 2 weeks ago- will refill 1x today- and then refill in 6 weeks-    13. F/U in 2 months with Emilia Harbour for chornic pain and me in 4 months double appt- [facility]   14. Oral drug screen- last Drug screen 01/16/24

## 2024-05-29 ENCOUNTER — Encounter: Payer: Self-pay | Admitting: Physical Medicine and Rehabilitation

## 2024-05-29 DIAGNOSIS — G8220 Paraplegia, unspecified: Secondary | ICD-10-CM

## 2024-05-29 DIAGNOSIS — L8990 Pressure ulcer of unspecified site, unspecified stage: Secondary | ICD-10-CM

## 2024-06-20 ENCOUNTER — Telehealth: Payer: Self-pay

## 2024-06-20 DIAGNOSIS — N390 Urinary tract infection, site not specified: Secondary | ICD-10-CM

## 2024-06-20 NOTE — Telephone Encounter (Signed)
 Patient called requesting orders be sent to [facility] in [facility] to evaluate for UTI.   Per Dr. Junior 3/6 note, will send orders for UA and urine culture.    [address] [place], [place] [address] F: [phone]  Duwaine Lowe, BSN, RN

## 2024-06-21 LAB — URINALYSIS
Bilirubin, UA: NEGATIVE
Glucose, UA: NEGATIVE
Ketones, UA: NEGATIVE
Nitrite, UA: NEGATIVE
Specific Gravity, UA: 1.007 (ref 1.005–1.030)
Urobilinogen, Ur: 0.2 mg/dL (ref 0.2–1.0)
pH, UA: 7.5 (ref 5.0–7.5)

## 2024-06-21 NOTE — Telephone Encounter (Signed)
 Patient called wanting to confirm what testing was ordered. Notified her that UA and urine culture were ordered.   Rachael Turner, BSN, RN

## 2024-06-26 LAB — URINE CULTURE

## 2024-06-27 ENCOUNTER — Other Ambulatory Visit: Payer: Self-pay

## 2024-06-27 ENCOUNTER — Telehealth: Payer: Self-pay

## 2024-06-27 MED ORDER — LEVOFLOXACIN 750 MG PO TABS: 750.0000 mg | ORAL_TABLET | Freq: Every day | ORAL | 0 refills | Status: AC

## 2024-06-27 NOTE — Telephone Encounter (Signed)
 Per Dr. Ronal patient will need to start Levofloxacin  750 mg every day for 5 days due to her urine culture. Patient verbalized understanding. Rx sent.  Rachael Turner ONEIDA Ligas, CMA

## 2024-07-02 ENCOUNTER — Encounter: Payer: Self-pay | Admitting: Internal Medicine

## 2024-07-02 DIAGNOSIS — N390 Urinary tract infection, site not specified: Secondary | ICD-10-CM

## 2024-07-03 NOTE — Telephone Encounter (Signed)
 Secure chat sent to MD

## 2024-07-05 ENCOUNTER — Encounter: Payer: Self-pay | Admitting: Physical Medicine and Rehabilitation

## 2024-07-05 NOTE — Telephone Encounter (Signed)
 Patient called stating today she is having nausea, migraine, dizzines. Has been getting worse since previous mychart message sent.  Patient is worried this is related due to UTI.  Lorenda CHRISTELLA Code, RMA

## 2024-07-06 ENCOUNTER — Telehealth: Payer: Self-pay | Admitting: Internal Medicine

## 2024-07-06 MED ORDER — CEFPODOXIME PROXETIL 100 MG PO TABS: 100.0000 mg | ORAL_TABLET | Freq: Two times a day (BID) | ORAL | 0 refills | Status: AC

## 2024-07-06 NOTE — Telephone Encounter (Signed)
 Secure chat forwarded to Dr. Luiz

## 2024-07-06 NOTE — Telephone Encounter (Signed)
 Per Dr. Luiz will need to send orders to [facility] for UA and Urine Cultures. Spoke with patient and faxed to [facility] in [facility]  [facility]  [address] [place], [place] [address] Phone: [phone]  Fax: [phone]  Lorenda CHRISTELLA Code, RMA

## 2024-07-06 NOTE — Addendum Note (Signed)
 Addended by: Amyla Heffner M on: 07/06/2024 08:31 AM   Modules accepted: Orders

## 2024-07-06 NOTE — Telephone Encounter (Signed)
 Patient has history of recurrent uti. She reports that she is having foul smelling urine, occasional flank pain and migraines. No fever, but not at her baseline  She has recent urine cx from 6/26 with low level proteus  Urine Culture, Routine Final report Abnormal   Organism ID, Bacteria Proteus mirabilis Abnormal   Comment: Cefazolin with an MIC <=16 predicts susceptibility to the oral agents cefaclor, cefdinir, cefpodoxime , cefprozil, cefuroxime, cephalexin, and loracarbef when used for therapy of uncomplicated urinary tract infections due to E. coli, Klebsiella pneumoniae, and Proteus mirabilis. Multi-Drug Resistant Organism Greater than 100,000 colony forming units per mL  ORGANISM ID, BACTERIA Comment  Comment: Mixed urogenital flora 50,000-100,000 colony forming units per mL  Antimicrobial Susceptibility Comment  Comment:       ** S = Susceptible; I = Intermediate; R = Resistant **                    P = Positive; N = Negative             MICS are expressed in micrograms per mL    Antibiotic                 RSLT#1    RSLT#2    RSLT#3    RSLT#4 Amoxicillin /Clavulanic Acid    S Ampicillin                     I Cefazolin                      S Cefepime                       S Cefoxitin                      I Cefpodoxime                     S Ceftriaxone                    S Ciprofloxacin                  R Ertapenem                      S Gentamicin                     I Meropenem                      S Nitrofurantoin                 R Piperacillin/Tazobactam        S Tetracycline                   R Tobramycin                     S Trimethoprim/Sulfa             R    Recommend for her to resend specimen for ua and urine cx We will send in rx for cefpodoxime . If she worsens then she can start taking. We will talk to her on Monday with latest results and discuss treatment plan.  Montie FURY Luiz MD MPH [facility] [phone]

## 2024-07-06 NOTE — Addendum Note (Signed)
 Addended by: Jenay Morici M on: 07/06/2024 10:59 AM   Modules accepted: Orders

## 2024-07-08 MED ORDER — OXYCODONE HCL 5 MG PO TABS: 5.0000 mg | ORAL_TABLET | ORAL | 0 refills | Status: DC | PRN

## 2024-07-09 ENCOUNTER — Other Ambulatory Visit: Payer: Self-pay | Admitting: Physical Medicine and Rehabilitation

## 2024-07-10 ENCOUNTER — Encounter: Payer: Self-pay | Admitting: Internal Medicine

## 2024-07-12 ENCOUNTER — Encounter: Payer: Self-pay | Admitting: Internal Medicine

## 2024-07-16 NOTE — Progress Notes (Unsigned)
 Subjective:    Patient ID: Rachael Turner, female    DOB: Apr 02, 1971, 53 y.o.   MRN: [id number]  HPI: Rachael Turner is a 53 y.o. female who returns for follow up appointment for chronic pain and medication refill. She states her pain is located in her neck radiating into her bilateral shoulders, she also reports her pain is deep pain. She rates her pain 5. Rachael Turner reports she is not following a exercise regimen.   Rachael Turner Morphine equivalent is 30.00 MME.  She is also prescribed Diazepam   .We have discussed the black box warning of using opioids and benzodiazepines. I highlighted the dangers of using these drugs together and discussed the adverse events including respiratory suppression, overdose, cognitive impairment and importance of compliance with current regimen. We will continue to monitor and adjust as indicated.   Otral Swab was Performed today.    Pain Inventory Average Pain 6 Pain Right Now 5 My pain is intermittent, sharp, dull, stabbing, and tingling  In the last 24 hours, has pain interfered with the following? General activity 9 Relation with others 9 Enjoyment of life 9 What TIME of day is your pain at its worst? varies Sleep (in general) Fair  Pain is worse with: some activites Pain improves with: rest, heat/ice, therapy/exercise, medication, and TENS Relief from Meds: good  No family history on file. Social History   Socioeconomic History   Marital status: Married    Spouse name: Not on file   Number of children: Not on file   Years of education: Not on file   Highest education level: Not on file  Occupational History   Not on file  Tobacco Use   Smoking status: Never   Smokeless tobacco: Never  Vaping Use   Vaping status: Never Used  Substance and Sexual Activity   Alcohol use: Not Currently    Comment: rare   Drug use: Never   Sexual activity: Yes    Birth control/protection: Surgical  Other Topics Concern   Not on file  Social  History Narrative   Not on file   Social Drivers of Health   Financial Resource Strain: Not on file  Food Insecurity: Low Risk  (02/21/2024)   Received from [facility]   Hunger Vital Sign    Within the past 12 months, you worried that your food would run out before you got money to buy more: Never true    Within the past 12 months, the food you bought just didn't last and you didn't have money to get more. : Never true  Transportation Needs: No Transportation Needs (02/21/2024)   Received from [facility]    In the past 12 months, has lack of reliable transportation kept you from medical appointments, meetings, work or from getting things needed for daily living? : No  Physical Activity: Not on file  Stress: Not on file  Social Connections: Unknown (05/07/2022)   Received from [facility]   Social Network    Social Network: Not on file   No past surgical history on file. No past surgical history on file. Past Medical History:  Diagnosis Date   C. difficile diarrhea 07/2020   Complication of anesthesia    Difficult intubation    There were no vitals taken for this visit.  Opioid Risk Score:   Fall Risk Score:  `1  Depression screen [facility] 2/9     10/21/2023    2:17 PM 07/18/2023    2:53  PM 01/17/2023    2:44 PM 05/21/2022    1:17 PM 02/12/2022    1:47 PM 03/20/2021    1:11 PM 11/07/2020    1:46 PM  Depression screen PHQ 2/9  Decreased Interest 1 1 0 1 0 1 1  Down, Depressed, Hopeless 1 1 0 1 0 1 1  PHQ - 2 Score 2 2 0 2 0 2 2    Review of Systems  Musculoskeletal:  Positive for arthralgias, back pain, gait problem and neck pain.       Pain right hand  All other systems reviewed and are negative.      Objective:   Physical Exam Vitals and nursing note reviewed.  Constitutional:      Appearance: Normal appearance.  Cardiovascular:     Rate and Rhythm: Normal rate and regular rhythm.     Pulses: Normal pulses.     Heart sounds: Normal heart  sounds.  Pulmonary:     Effort: Pulmonary effort is normal.     Breath sounds: Normal breath sounds.  Musculoskeletal:     Comments: Normal Muscle Bulk and Muscle Testing Reveals:  Upper Extremities: Full ROM and Muscle Strength 5/5 Bilateral AC Joint Tenderness Lower Extremities: Paralysis  Arrived in wheelchair     Skin:    General: Skin is warm and dry.  Neurological:     Mental Status: She is alert and oriented to person, place, and time.  Psychiatric:        Mood and Affect: Mood normal.        Behavior: Behavior normal.          Assessment & Plan:  Cervicalgia/ Cervical Radiculitis: Continue current medication regimen. Continue to Monitor.  Paraplegia/ Complete: Continue current medication regimen. Continue to Monitor.  Neurogenic Bladder: Continue Bladde rProgram: Continue to Monitor.  Wheelchair Dependent: Continue to Monitor.  Chronic Pain Syndrome: Refilled: Oxycodone  5 mg one tablet every 4 hours as needed for pain 3120, using it sparingly. We will continue the opioid monitoring program, this consists of regular clinic visits, examinations, urine drug screen, pill counts as well as use of [place]  Controlled Substance Reporting system. A 12 month History has been reviewed on the [place]  Controlled Substance Reporting System  on 07/17/2024  F/U withDr Lovorn

## 2024-07-17 ENCOUNTER — Encounter: Payer: Self-pay | Admitting: Registered Nurse

## 2024-07-17 ENCOUNTER — Encounter: Attending: Physical Medicine and Rehabilitation | Admitting: Registered Nurse

## 2024-07-17 VITALS — BP 111/71 | HR 75 | Ht 70.0 in

## 2024-07-17 DIAGNOSIS — Z79891 Long term (current) use of opiate analgesic: Secondary | ICD-10-CM | POA: Insufficient documentation

## 2024-07-17 DIAGNOSIS — M5412 Radiculopathy, cervical region: Secondary | ICD-10-CM | POA: Insufficient documentation

## 2024-07-17 DIAGNOSIS — Z5181 Encounter for therapeutic drug level monitoring: Secondary | ICD-10-CM | POA: Diagnosis present

## 2024-07-17 DIAGNOSIS — Z993 Dependence on wheelchair: Secondary | ICD-10-CM | POA: Insufficient documentation

## 2024-07-17 DIAGNOSIS — M542 Cervicalgia: Secondary | ICD-10-CM | POA: Diagnosis present

## 2024-07-17 DIAGNOSIS — N319 Neuromuscular dysfunction of bladder, unspecified: Secondary | ICD-10-CM | POA: Insufficient documentation

## 2024-07-17 DIAGNOSIS — G8221 Paraplegia, complete: Secondary | ICD-10-CM | POA: Diagnosis not present

## 2024-07-17 DIAGNOSIS — G894 Chronic pain syndrome: Secondary | ICD-10-CM | POA: Diagnosis not present

## 2024-07-17 MED ORDER — OXYCODONE HCL 5 MG PO TABS: 5.0000 mg | ORAL_TABLET | Freq: Four times a day (QID) | ORAL | 0 refills | Status: DC | PRN

## 2024-07-19 ENCOUNTER — Encounter: Admitting: Registered Nurse

## 2024-07-21 LAB — DRUG TOX MONITOR 1 W/CONF, ORAL FLD
Amphetamines: NEGATIVE ng/mL (ref <10)
Barbiturates: NEGATIVE ng/mL (ref <10)
Benzodiazepines: NEGATIVE ng/mL (ref <0.50)
Buprenorphine: NEGATIVE ng/mL (ref <0.10)
Cocaine: NEGATIVE ng/mL (ref <5.0)
Codeine: NEGATIVE ng/mL (ref <2.5)
Dihydrocodeine: NEGATIVE ng/mL (ref <2.5)
Fentanyl: NEGATIVE ng/mL (ref <0.10)
Heroin Metabolite: NEGATIVE ng/mL (ref <1.0)
Hydrocodone: NEGATIVE ng/mL (ref <2.5)
Hydromorphone: NEGATIVE ng/mL (ref <2.5)
MARIJUANA: NEGATIVE ng/mL (ref <2.5)
MDMA: NEGATIVE ng/mL (ref <10)
Meprobamate: NEGATIVE ng/mL (ref <2.5)
Methadone: NEGATIVE ng/mL (ref <5.0)
Morphine: NEGATIVE ng/mL (ref <2.5)
Nicotine Metabolite: NEGATIVE ng/mL (ref <5.0)
Norhydrocodone: NEGATIVE ng/mL (ref <2.5)
Noroxycodone: 5.5 ng/mL — ABNORMAL HIGH (ref <2.5)
Opiates: POSITIVE ng/mL — AB (ref <2.5)
Oxycodone: 84.3 ng/mL — ABNORMAL HIGH (ref <2.5)
Oxymorphone: NEGATIVE ng/mL (ref <2.5)
Phencyclidine: NEGATIVE ng/mL (ref <10)
Tapentadol: NEGATIVE ng/mL (ref <5.0)
Tramadol: NEGATIVE ng/mL (ref <5.0)
Zolpidem: NEGATIVE ng/mL (ref <5.0)

## 2024-07-21 LAB — DRUG TOX ALC METAB W/CON, ORAL FLD: Alcohol Metabolite: NEGATIVE ng/mL (ref <25)

## 2024-09-13 ENCOUNTER — Encounter: Payer: Self-pay | Admitting: Internal Medicine

## 2024-09-13 ENCOUNTER — Encounter: Payer: Self-pay | Admitting: Physical Medicine and Rehabilitation

## 2024-09-19 ENCOUNTER — Telehealth: Payer: Self-pay | Admitting: *Deleted

## 2024-09-19 NOTE — Telephone Encounter (Signed)
Requesting refill on oxycodone 

## 2024-09-20 MED ORDER — OXYCODONE HCL 5 MG PO TABS: 5.0000 mg | ORAL_TABLET | Freq: Four times a day (QID) | ORAL | 0 refills | Status: AC | PRN

## 2024-09-20 NOTE — Addendum Note (Signed)
 Addended by: Audie Stayer on: 09/20/2024 01:42 PM   Modules accepted: Orders

## 2024-09-25 NOTE — Telephone Encounter (Signed)
 She has appt with Rachael Turner after her appt with Dr Lovorn.

## 2024-09-28 ENCOUNTER — Encounter: Admitting: Physical Medicine and Rehabilitation

## 2024-10-22 ENCOUNTER — Other Ambulatory Visit: Payer: Self-pay | Admitting: Physical Medicine and Rehabilitation

## 2024-10-25 ENCOUNTER — Other Ambulatory Visit: Payer: Self-pay | Admitting: Physical Medicine and Rehabilitation

## 2024-11-19 ENCOUNTER — Encounter: Payer: Self-pay | Admitting: Internal Medicine

## 2024-11-19 NOTE — Telephone Encounter (Signed)
 Secure chat sent to provider.

## 2024-11-20 NOTE — Progress Notes (Signed)
 Subjective:    Patient ID: Rachael Turner, female    DOB: 1971-12-12, 53 y.o.   MRN: [id number]  HPI: Rachael Turner is a 53 y.o. female who returns for follow up appointment for chronic pain and medication refill. She states her pain is located in her neck radiating into her bilateral shoulders also reports radiating into her occipital region, has chronic headache and neurology following she reports. She rates her pain 5.   Ms. Vanleeuwen Morphine equivalent is 30.00 MME.   Oral Swab was Performed today.      Pain Inventory Average Pain 4 Pain Right Now 5 My pain is sharp, dull, stabbing, tingling, and aching  In the last 24 hours, has pain interfered with the following? General activity 4 Relation with others 4 Enjoyment of life 5 What TIME of day is your pain at its worst? varies Sleep (in general) Fair  Pain is worse with: some activites Pain improves with: medication, TENS, and injections Relief from Meds: 10  No family history on file. Social History   Socioeconomic History   Marital status: Married    Spouse name: Not on file   Number of children: Not on file   Years of education: Not on file   Highest education level: Not on file  Occupational History   Not on file  Tobacco Use   Smoking status: Never   Smokeless tobacco: Never  Vaping Use   Vaping status: Never Used  Substance and Sexual Activity   Alcohol use: Not Currently    Comment: rare   Drug use: Never   Sexual activity: Yes    Birth control/protection: Surgical  Other Topics Concern   Not on file  Social History Narrative   Not on file   Social Drivers of Health   Financial Resource Strain: Not on file  Food Insecurity: Low Risk  (02/21/2024)   Received from [facility]   Hunger Vital Sign    Within the past 12 months, you worried that your food would run out before you got money to buy more: Never true    Within the past 12 months, the food you bought just didn't last and you didn't have  money to get more. : Never true  Transportation Needs: No Transportation Needs (02/21/2024)   Received from [facility]    In the past 12 months, has lack of reliable transportation kept you from medical appointments, meetings, work or from getting things needed for daily living? : No  Physical Activity: Not on file  Stress: Not on file  Social Connections: Unknown (05/07/2022)   Received from [facility]   Social Network    Social Network: Not on file   No past surgical history on file. No past surgical history on file. Past Medical History:  Diagnosis Date   C. difficile diarrhea 07/2020   Complication of anesthesia    Difficult intubation    There were no vitals taken for this visit.  Opioid Risk Score:   Fall Risk Score:  `1  Depression screen [facility] 2/9     07/17/2024    2:33 PM 10/21/2023    2:17 PM 07/18/2023    2:53 PM 01/17/2023    2:44 PM 05/21/2022    1:17 PM 02/12/2022    1:47 PM 03/20/2021    1:11 PM  Depression screen PHQ 2/9  Decreased Interest 1 1 1  0 1 0 1  Down, Depressed, Hopeless 1 1 1  0 1 0 1  PHQ - 2 Score 2 2 2  0 2 0 2    Review of Systems  Musculoskeletal:  Positive for back pain, gait problem and neck pain.       Pain in both shoulders  Neurological:  Positive for headaches.  All other systems reviewed and are negative.      Objective:   Physical Exam Vitals and nursing note reviewed.  Constitutional:      Appearance: Normal appearance.  Neck:     Comments: Cervical Paraspinal Tenderness: C-5-C-6 Cardiovascular:     Rate and Rhythm: Normal rate and regular rhythm.     Pulses: Normal pulses.     Heart sounds: Normal heart sounds.  Pulmonary:     Effort: Pulmonary effort is normal.     Breath sounds: Normal breath sounds.  Musculoskeletal:     Comments: Normal Muscle Bulk and Muscle Testing Reveals:  Upper Extremities: Full ROM and Muscle Strength 5/5 Bilateral AC Joint Tenderness  Thoracic Paraspinal Tenderness:  T-4-T-6 Lower Extremities: Paralysis  Arrived in wheelchair      Skin:    General: Skin is warm and dry.  Neurological:     Mental Status: She is alert and oriented to person, place, and time.  Psychiatric:        Mood and Affect: Mood normal.        Behavior: Behavior normal.          Assessment & Plan:  Cervicalgia/ Cervical Radiculitis: Continue current medication regimen. Continue to Monitor. 11/27/2024 Paraplegia/ Complete: Continue current medication regimen. Continue to Monitor. 11/27/2024 Neurogenic Bladder: Continue Bladder Program: She is performing IN and Out Cath every 3 hours with 600 cc/ output: Continue to Monitor. 11/27/2024 Wheelchair Dependent: Continue to Monitor. 11/27/2024 Chronic Pain Syndrome: Refilled: Oxycodone  5 mg one tablet every 4 hours as needed for pain #120, using it sparingly. We will continue the opioid monitoring program, this consists of regular clinic visits, examinations, urine drug screen, pill counts as well as use of [place]  Controlled Substance Reporting system. A 12 month History has been reviewed on the [place]  Controlled Substance Reporting System  on 11/27/2024   F/U withDr Lovorn

## 2024-11-27 ENCOUNTER — Encounter: Payer: Self-pay | Admitting: Registered Nurse

## 2024-11-27 ENCOUNTER — Encounter: Payer: Self-pay | Attending: Registered Nurse | Admitting: Registered Nurse

## 2024-11-27 VITALS — BP 135/80 | HR 74 | Ht 70.0 in

## 2024-11-27 DIAGNOSIS — G8221 Paraplegia, complete: Secondary | ICD-10-CM | POA: Diagnosis present

## 2024-11-27 DIAGNOSIS — M5412 Radiculopathy, cervical region: Secondary | ICD-10-CM | POA: Diagnosis present

## 2024-11-27 DIAGNOSIS — G894 Chronic pain syndrome: Secondary | ICD-10-CM | POA: Insufficient documentation

## 2024-11-27 DIAGNOSIS — Z79891 Long term (current) use of opiate analgesic: Secondary | ICD-10-CM | POA: Insufficient documentation

## 2024-11-27 DIAGNOSIS — Z5181 Encounter for therapeutic drug level monitoring: Secondary | ICD-10-CM | POA: Insufficient documentation

## 2024-11-27 DIAGNOSIS — Z993 Dependence on wheelchair: Secondary | ICD-10-CM | POA: Diagnosis present

## 2024-11-27 DIAGNOSIS — M542 Cervicalgia: Secondary | ICD-10-CM | POA: Insufficient documentation

## 2024-11-27 DIAGNOSIS — N319 Neuromuscular dysfunction of bladder, unspecified: Secondary | ICD-10-CM | POA: Diagnosis present

## 2024-11-27 MED ORDER — OXYCODONE HCL 5 MG PO TABS: 5.0000 mg | ORAL_TABLET | ORAL | 0 refills | Status: DC | PRN

## 2024-11-30 LAB — DRUG TOX MONITOR 1 W/CONF, ORAL FLD
AMINOCLONAZEPAM: NEGATIVE ng/mL (ref <0.50)
Alprazolam: NEGATIVE ng/mL (ref <0.50)
Amphetamine: NEGATIVE ng/mL (ref <10)
Amphetamines: NEGATIVE ng/mL (ref <10)
Barbiturates: NEGATIVE ng/mL (ref <10)
Benzodiazepines: POSITIVE ng/mL — AB (ref <0.50)
Buprenorphine: NEGATIVE ng/mL (ref <0.10)
Chlordiazepoxide: NEGATIVE ng/mL (ref <0.50)
Clonazepam: NEGATIVE ng/mL (ref <0.50)
Cocaine: NEGATIVE ng/mL (ref <5.0)
Codeine: NEGATIVE ng/mL (ref <2.5)
Diazepam: NEGATIVE ng/mL (ref <0.50)
Dihydrocodeine: NEGATIVE ng/mL (ref <2.5)
Fentanyl: NEGATIVE ng/mL (ref <0.10)
Flunitrazepam: NEGATIVE ng/mL (ref <0.50)
Flurazepam: NEGATIVE ng/mL (ref <0.50)
Heroin Metabolite: NEGATIVE ng/mL (ref <1.0)
Hydrocodone: NEGATIVE ng/mL (ref <2.5)
Hydromorphone: NEGATIVE ng/mL (ref <2.5)
Lorazepam: NEGATIVE ng/mL (ref <0.50)
MARIJUANA: NEGATIVE ng/mL (ref <2.5)
MDMA: NEGATIVE ng/mL (ref <10)
Meprobamate: NEGATIVE ng/mL (ref <2.5)
Methadone: NEGATIVE ng/mL (ref <5.0)
Methamphetamine: NEGATIVE ng/mL (ref <10)
Midazolam: NEGATIVE ng/mL (ref <0.50)
Morphine: NEGATIVE ng/mL (ref <2.5)
Nicotine Metabolite: NEGATIVE ng/mL (ref <5.0)
Nordiazepam: 0.71 ng/mL — ABNORMAL HIGH (ref <0.50)
Norhydrocodone: NEGATIVE ng/mL (ref <2.5)
Noroxycodone: NEGATIVE ng/mL (ref <2.5)
Opiates: POSITIVE ng/mL — AB (ref <2.5)
Oxazepam: NEGATIVE ng/mL (ref <0.50)
Oxycodone: 7.7 ng/mL — ABNORMAL HIGH (ref <2.5)
Oxymorphone: NEGATIVE ng/mL (ref <2.5)
Phencyclidine: NEGATIVE ng/mL (ref <10)
Tapentadol: NEGATIVE ng/mL (ref <5.0)
Temazepam: NEGATIVE ng/mL (ref <0.50)
Tramadol: NEGATIVE ng/mL (ref <5.0)
Triazolam: NEGATIVE ng/mL (ref <0.50)
Zolpidem: NEGATIVE ng/mL (ref <5.0)

## 2024-11-30 LAB — DRUG TOX ALC METAB W/CON, ORAL FLD: Alcohol Metabolite: NEGATIVE ng/mL (ref <25)

## 2024-12-11 ENCOUNTER — Encounter: Payer: Self-pay | Admitting: Internal Medicine

## 2024-12-12 ENCOUNTER — Other Ambulatory Visit: Payer: Self-pay | Admitting: Internal Medicine

## 2024-12-12 MED ORDER — AMOXICILLIN 500 MG PO CAPS: 1000.0000 mg | ORAL_CAPSULE | Freq: Three times a day (TID) | ORAL | 0 refills | Status: DC

## 2024-12-12 MED ORDER — AMOXICILLIN-POT CLAVULANATE 875-125 MG PO TABS: 1.0000 | ORAL_TABLET | Freq: Two times a day (BID) | ORAL | 0 refills | Status: DC

## 2024-12-12 NOTE — Progress Notes (Signed)
 Patient having urinary symptoms concerning for recurrent uti. Reviewed urine cx -amox 1gm TID

## 2024-12-12 NOTE — Telephone Encounter (Signed)
 Secure chat sent

## 2025-01-14 ENCOUNTER — Other Ambulatory Visit: Payer: Self-pay | Admitting: Internal Medicine

## 2025-01-14 ENCOUNTER — Encounter: Payer: Self-pay | Admitting: Internal Medicine

## 2025-01-14 MED ORDER — AMOXICILLIN 500 MG PO CAPS: 1000.0000 mg | ORAL_CAPSULE | Freq: Three times a day (TID) | ORAL | 0 refills | Status: AC

## 2025-01-14 NOTE — Progress Notes (Signed)
 Patient having recurring dysuria, increased urinary frequency. Pcp did repeat urine cux showing proteus that is S to amp and amox/clav. Resent in rx for high dose amox.

## 2025-02-01 ENCOUNTER — Encounter: Attending: Registered Nurse | Admitting: Physical Medicine and Rehabilitation

## 2025-02-01 ENCOUNTER — Encounter: Payer: Self-pay | Admitting: Physical Medicine and Rehabilitation

## 2025-02-01 DIAGNOSIS — N390 Urinary tract infection, site not specified: Secondary | ICD-10-CM

## 2025-02-01 DIAGNOSIS — Z993 Dependence on wheelchair: Secondary | ICD-10-CM

## 2025-02-01 DIAGNOSIS — M542 Cervicalgia: Secondary | ICD-10-CM

## 2025-02-01 DIAGNOSIS — G909 Disorder of the autonomic nervous system, unspecified: Secondary | ICD-10-CM

## 2025-02-01 DIAGNOSIS — Z5181 Encounter for therapeutic drug level monitoring: Secondary | ICD-10-CM

## 2025-02-01 DIAGNOSIS — G8221 Paraplegia, complete: Secondary | ICD-10-CM

## 2025-02-01 DIAGNOSIS — R252 Cramp and spasm: Secondary | ICD-10-CM

## 2025-02-01 DIAGNOSIS — G894 Chronic pain syndrome: Secondary | ICD-10-CM

## 2025-02-01 MED ORDER — OXYCODONE HCL 5 MG PO TABS: 5.0000 mg | ORAL_TABLET | ORAL | 0 refills | Status: AC | PRN

## 2025-02-01 MED ORDER — DIAZEPAM 5 MG PO TABS: 5.0000 mg | ORAL_TABLET | Freq: Two times a day (BID) | ORAL | 5 refills | Status: AC | PRN

## 2025-02-01 NOTE — Patient Instructions (Signed)
 Plan:  Try with new Erman to see if they can reproduce your problem with air not in L front quadrant, and if not, then it's your cushion that the problem.    2.    Con't Oxycodone - will refill- last Drug screen 11/27/24- is consistent-   5 mg 4x/day as needed- has 45 pills left.    3. Con't  Baclofen  40 mg TID and Dantrolene  100 mg TID- for spasticity- -  4. LFT's have been looking fine per pt- PCP gets them drawn- let me see if are elevated, please  5. Con't Valium  5 mg 2x/day as needed- for spasticity- takes rarely- can take >2 hours away from Oxycodone - for severe spasms.   6. Wounds doing better- just DTI on L heel  7. Going to see ID about frequent UTIs, maybe not treated enough per pt Sx's.    8. F/U  in 3months double appt-  in person- SCI
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
## 2025-04-02 ENCOUNTER — Encounter: Payer: Self-pay | Admitting: Registered Nurse

## 2025-05-31 ENCOUNTER — Encounter: Payer: Self-pay | Admitting: Physical Medicine and Rehabilitation
